# Patient Record
Sex: Female | Born: 1985 | Race: White | Hispanic: No | Marital: Married | Smoking: Former smoker
Health system: Southern US, Community
[De-identification: ages and names within clinical notes are randomized; demographics above are authoritative.]

## PROBLEM LIST (undated history)

## (undated) DIAGNOSIS — J45909 Unspecified asthma, uncomplicated: Secondary | ICD-10-CM

## (undated) DIAGNOSIS — Z789 Other specified health status: Secondary | ICD-10-CM

## (undated) DIAGNOSIS — Z8619 Personal history of other infectious and parasitic diseases: Secondary | ICD-10-CM

## (undated) HISTORY — PX: NO PAST SURGERIES: SHX2092

---
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
## 2004-02-24 ENCOUNTER — Other Ambulatory Visit: Admission: RE | Admit: 2004-02-24 | Discharge: 2004-02-24 | Payer: Self-pay | Admitting: Obstetrics and Gynecology

## 2004-11-29 ENCOUNTER — Other Ambulatory Visit: Admission: RE | Admit: 2004-11-29 | Discharge: 2004-11-29 | Payer: Self-pay | Admitting: Obstetrics and Gynecology

## 2005-12-17 ENCOUNTER — Other Ambulatory Visit: Admission: RE | Admit: 2005-12-17 | Discharge: 2005-12-17 | Payer: Self-pay | Admitting: Obstetrics & Gynecology

## 2007-01-13 ENCOUNTER — Other Ambulatory Visit: Admission: RE | Admit: 2007-01-13 | Discharge: 2007-01-13 | Payer: Self-pay | Admitting: Obstetrics & Gynecology

## 2007-07-24 ENCOUNTER — Ambulatory Visit (HOSPITAL_COMMUNITY): Admission: RE | Admit: 2007-07-24 | Discharge: 2007-07-24 | Payer: Self-pay | Admitting: Family Medicine

## 2008-05-26 ENCOUNTER — Other Ambulatory Visit: Admission: RE | Admit: 2008-05-26 | Discharge: 2008-05-26 | Payer: Self-pay | Admitting: Obstetrics and Gynecology

## 2009-08-25 ENCOUNTER — Ambulatory Visit (HOSPITAL_COMMUNITY): Admission: RE | Admit: 2009-08-25 | Discharge: 2009-08-25 | Payer: Self-pay | Admitting: Family Medicine

## 2009-09-18 HISTORY — PX: BREAST ENHANCEMENT SURGERY: SHX7

## 2010-05-29 IMAGING — CR DG CHEST 2V
2 series · 2 of 2 positions shown · non-contrast
Comparison: None

CLINICAL DATA: Cough.  Wheezing.

CHEST - 2 VIEW

[view not recorded (1 of 2)]
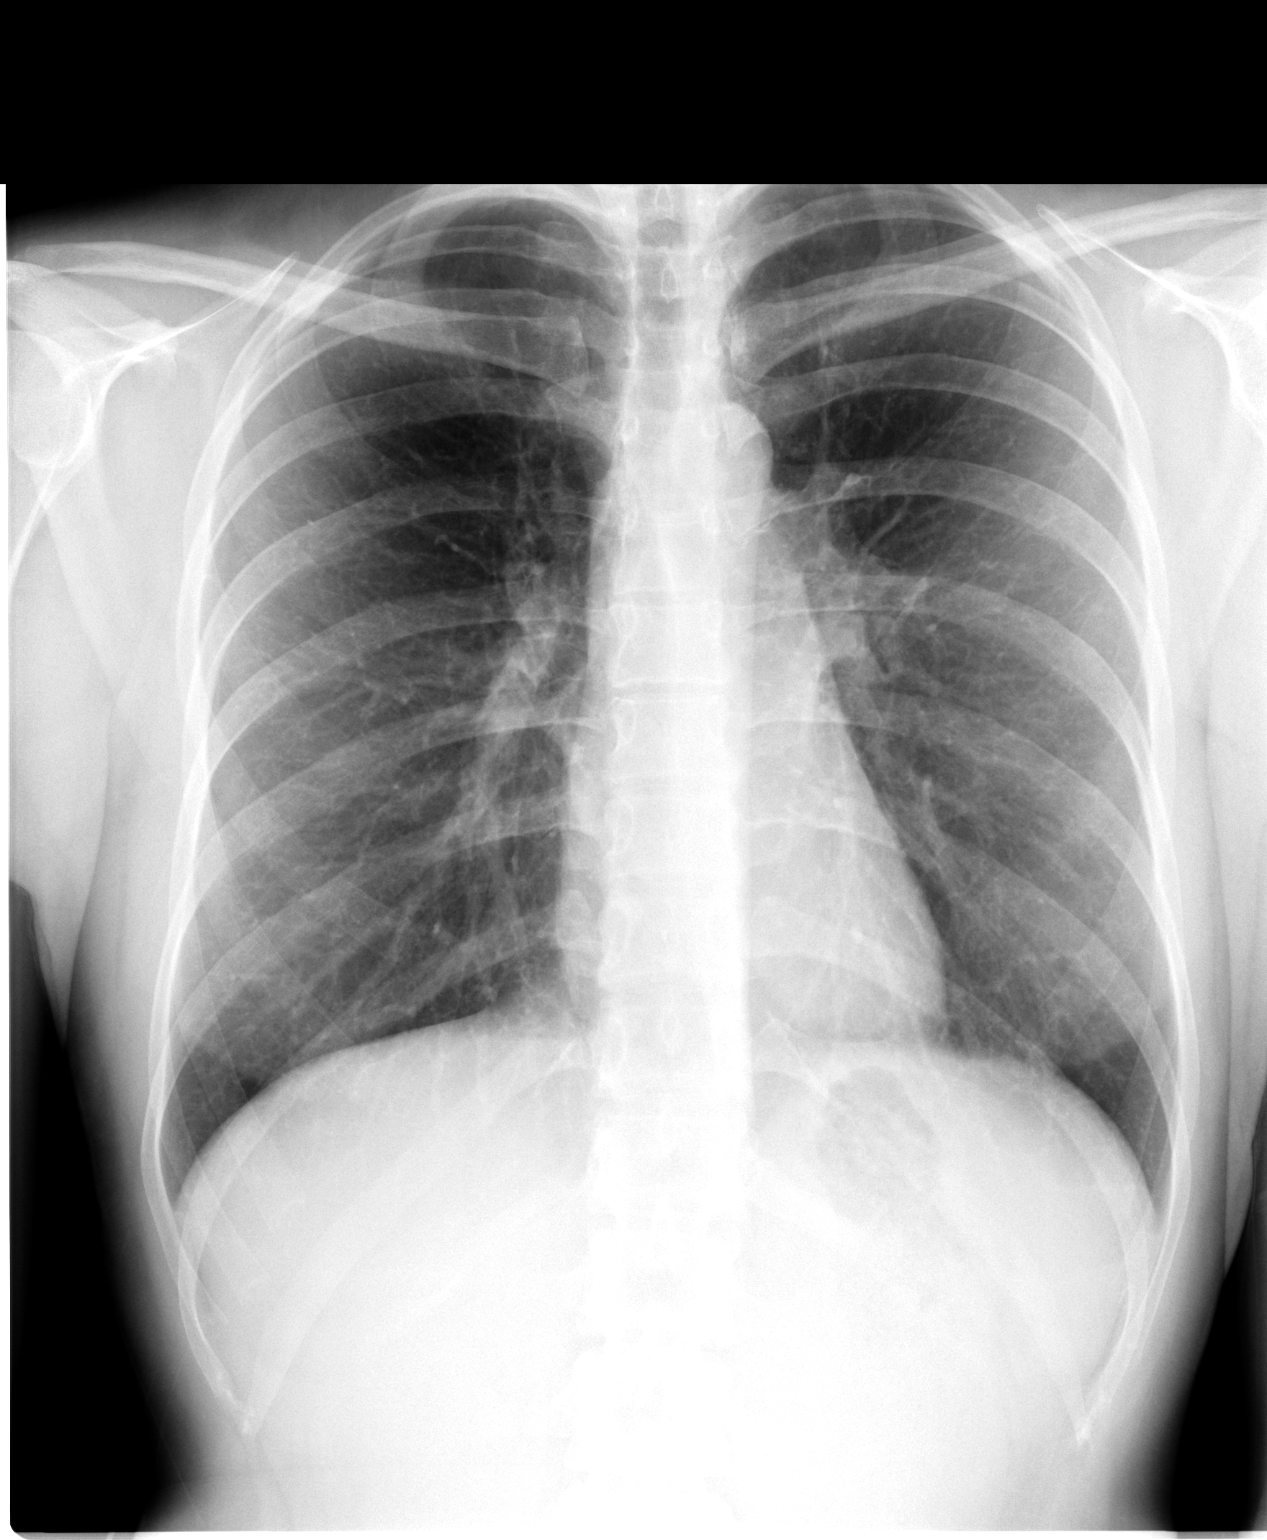

[view not recorded (2 of 2)]
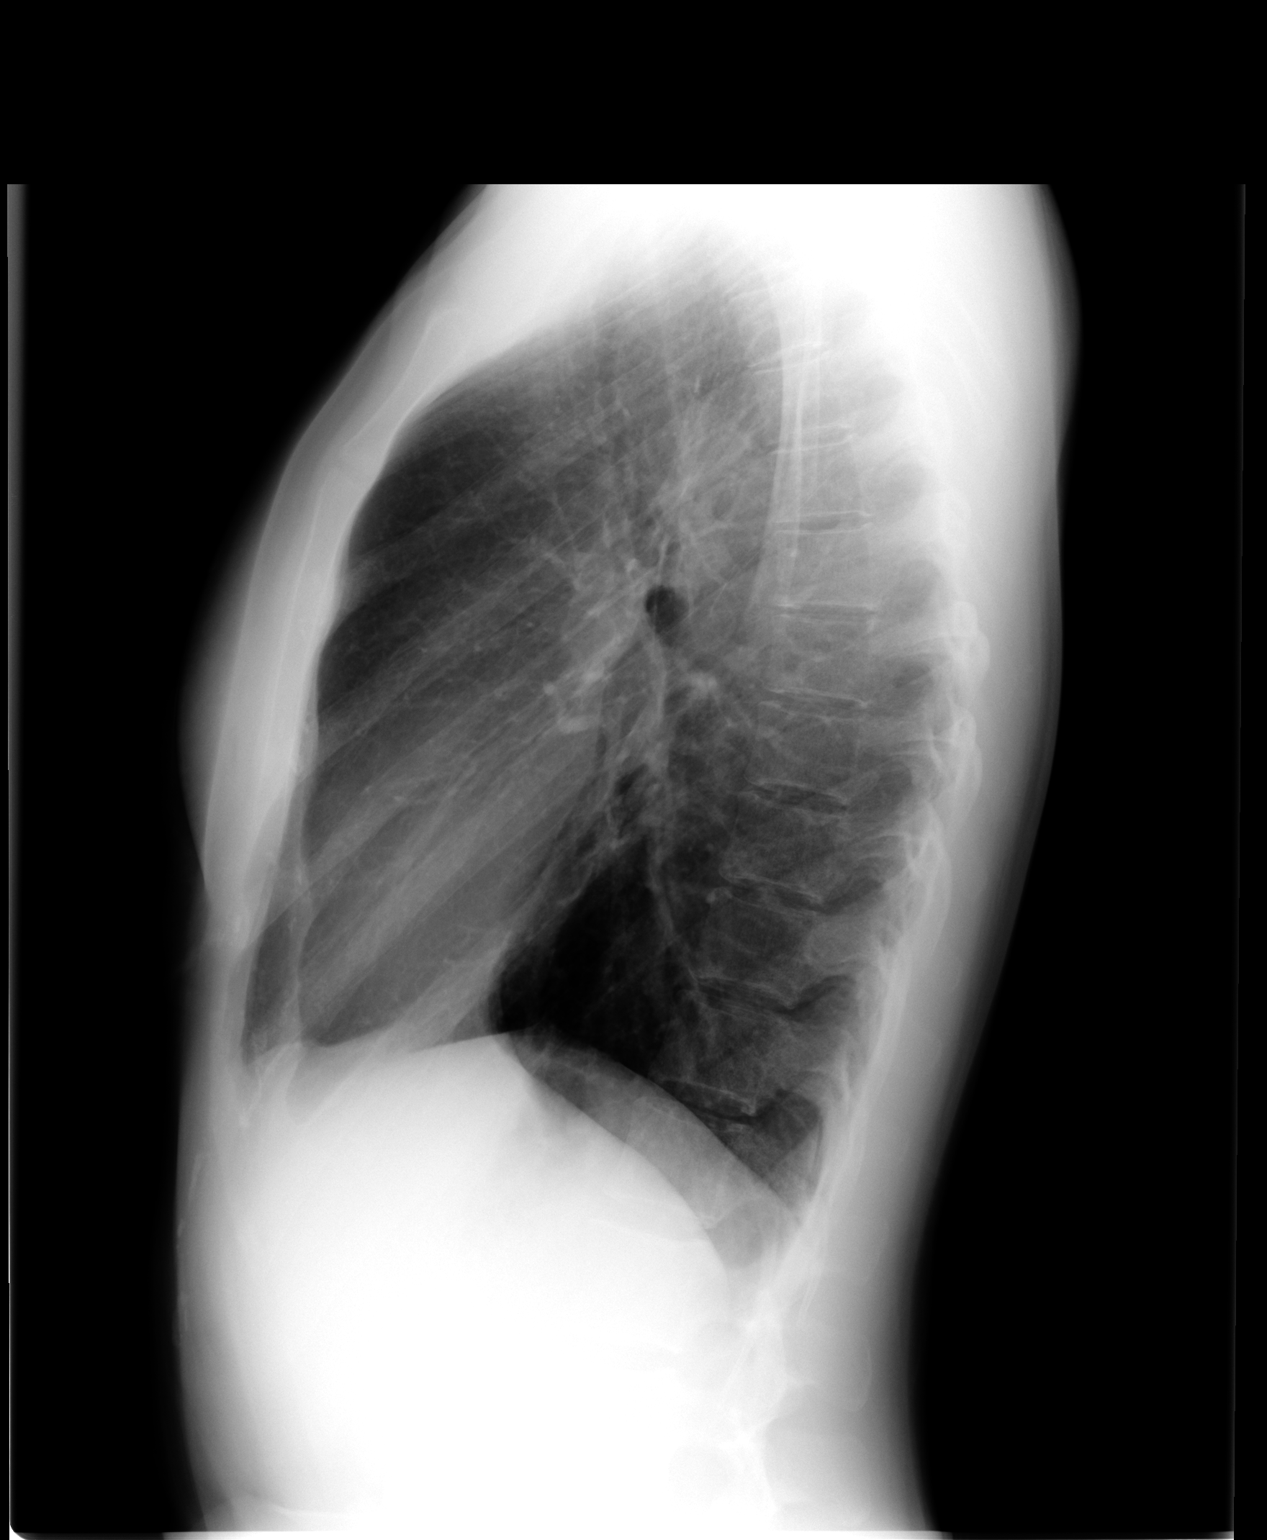

[2 of 2 positions shown; findings below may reference images not displayed]

FINDINGS: The heart size and mediastinal contours are within
normal limits.  Both lungs are clear.  The visualized skeletal
structures are unremarkable.
IMPRESSION: No active cardiopulmonary disease.

## 2013-05-20 ENCOUNTER — Encounter: Payer: Self-pay | Admitting: Gynecology

## 2013-05-24 ENCOUNTER — Ambulatory Visit (INDEPENDENT_AMBULATORY_CARE_PROVIDER_SITE_OTHER): Payer: PRIVATE HEALTH INSURANCE | Admitting: Gynecology

## 2013-05-24 ENCOUNTER — Encounter: Payer: Self-pay | Admitting: Gynecology

## 2013-05-24 VITALS — BP 102/64 | HR 68 | Resp 14 | Ht 66.0 in | Wt 153.0 lb

## 2013-05-24 DIAGNOSIS — Z8742 Personal history of other diseases of the female genital tract: Secondary | ICD-10-CM

## 2013-05-24 DIAGNOSIS — Z87898 Personal history of other specified conditions: Secondary | ICD-10-CM

## 2013-05-24 DIAGNOSIS — N871 Moderate cervical dysplasia: Secondary | ICD-10-CM

## 2013-05-24 DIAGNOSIS — N978 Female infertility of other origin: Secondary | ICD-10-CM

## 2013-05-24 NOTE — Progress Notes (Signed)
Subjective:     Patient ID: Kim [name], female   DOB: 04-12-1986, 27 y.o.   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  Pt states that they found out that her husband has a low sperm count-37m/ml, and have done 3 cycles of insemination without success, she was wondering what the next step should be, right now she feels exhausted and they are taking a break.    Review of Systems     Objective:   Physical Exam  Genitourinary:      Patient's last menstrual period was 05/19/2013.    Blood pressure 102/64, pulse 68, resp. rate 14, height 5\' 6"  (1.676 m), weight 153 lb (69.4 kg), last menstrual period 05/19/2013.  Procedure explained and patient's questions were invited and answered.  Consent form signed.    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   Speculum inserted atraumatically and cervix visualized.  3% acetic acid applied.  Cervix examined using 3.75 and 7.5, 15   X magnification and green filter.    Gross appearance:normal  Squamocolumnar junction seen in entirety: yes   no visible lesions, no mosaicism, no punctation and no abnormal vasculature  endocervical speculum placed and SCJ visualized 360 degrees without lesions, see pix  Patient tolerated procedure well.   Plan:  Will base further treatment on Pathology findings.  Post biopsy instructions and AVS given to patient.            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 Long discussion regarding the affects of HPV, and the anticipated resolution of low grade lesions, pt and husband had used condoms until recently. PAP done. Discussed female factor infertility, best chances for conception to be IVF/ICSI, recommend not using contraception while on break from pregnancy attempts in case they do conceive. Briefly reviewed ASRM recommendations regarding single embryo transfer. Encouraged that both have quit smoking Husband considering urology consult

## 2013-05-26 LAB — IPS CERVICAL/ECC/EMB/VULVAR/VAGINAL BIOPSY

## 2013-06-24 ENCOUNTER — Telehealth: Payer: Self-pay | Admitting: Gynecology

## 2013-06-24 NOTE — Telephone Encounter (Signed)
Call to patient, has been attempting pregnancy for 1 1/2 years Been referred to REI/ husband with abnormal sperm count that has been corrected for last 2 months No bleeding/ some menstrual cramping for several days/breast tenderness noticed today. Advised monitor/observe/ Tylenol and hydration for cramping Repeat UPT as desired with first void and brand name preg test (not from dollar store) Call if 14 days late for menses with no bleeding or if has positive pregnancy test. Will confirm with Dr Farrel Gobble and call back if any additional instructions.  Had 6 month colpo/pap 05-24-13, per chart LMP 05-19-13.

## 2013-06-24 NOTE — Telephone Encounter (Addendum)
Spoke with pt who is 8 days late with her cycle. Pt reports she is "never late." Pt took UPT today and it was negative. Please advise. If someone calls back tomorrow 8-8, they should use cell number instead of work number.

## 2013-06-24 NOTE — Telephone Encounter (Signed)
8 days late on cycle, upt negative.

## 2013-07-01 ENCOUNTER — Ambulatory Visit: Payer: PRIVATE HEALTH INSURANCE | Admitting: Gynecology

## 2013-09-20 ENCOUNTER — Telehealth: Payer: Self-pay | Admitting: Gynecology

## 2013-09-20 NOTE — Telephone Encounter (Signed)
Patient has about 45 days in between cycles for the last 6 months. Patient had fertility treatments before that. Wasn't sure what she needed to do. (looking for chart)

## 2013-09-20 NOTE — Telephone Encounter (Signed)
Spoke with pt who has been having 45 day cycles, with one cycle being 37 days. Pt saw Dr. Laural Benes at [facility] for infertility, after Dr. April Manson left the practice. Her last treatment was in May. Pt and husband were taking a break to see if they could get pregnant on their own. Advised pt she could call Dr. Laural Benes to see what her next step would be, and that I would also route to TL to see if she has any advice. Pt agreeable. Any advice or need for OV here, or should pt see Dr. Laural Benes?

## 2013-09-20 NOTE — Telephone Encounter (Signed)
LMTCB  aa 

## 2013-09-21 NOTE — Telephone Encounter (Signed)
i'd be happy to see her if she wants

## 2013-09-21 NOTE — Telephone Encounter (Addendum)
Spoke with pt who reports she called Dr. Henriette Combs office to let them know about her 45 day cycles, and they did not seem too concerned. Pt says that she has been experiencing 3 week long PMS symptoms and a heaviness towards the end of her period that causes urinary urgency at times. Pt cannot even work out at the end of her cycle because of the heaviness and pressure. "I feel like I'm going to wet on myself." Sched appt 09-27-13 at 4:45.

## 2013-09-21 NOTE — Telephone Encounter (Signed)
LMTCB if pt would like appt to see Dr. Farrel Gobble.

## 2013-09-27 ENCOUNTER — Ambulatory Visit: Payer: PRIVATE HEALTH INSURANCE | Admitting: Gynecology

## 2013-09-28 ENCOUNTER — Ambulatory Visit (INDEPENDENT_AMBULATORY_CARE_PROVIDER_SITE_OTHER): Payer: PRIVATE HEALTH INSURANCE | Admitting: Gynecology

## 2013-09-28 VITALS — BP 116/64 | HR 70 | Resp 12 | Ht 66.0 in | Wt 145.0 lb

## 2013-09-28 DIAGNOSIS — N926 Irregular menstruation, unspecified: Secondary | ICD-10-CM

## 2013-09-28 DIAGNOSIS — N871 Moderate cervical dysplasia: Secondary | ICD-10-CM

## 2013-09-28 DIAGNOSIS — N978 Female infertility of other origin: Secondary | ICD-10-CM

## 2013-09-28 LAB — POCT URINALYSIS DIPSTICK
Leukocytes, UA: NEGATIVE
pH, UA: 5

## 2013-09-28 NOTE — Progress Notes (Signed)
Pt was being seen at [facility] for infertility, treated with femara and IUI, pt reports that she had 6+ follicles but never conceived.  She had 3 cycles.  Female factor also present 24m/mil sperm. Cycles have been irregluar [phone].  Just finished cycle 11/9, started 11/4.   Pt and husband are not interested in IVF at this time, they are trying to sell their house.  Pt had a history of regular cycles until this last few months. Pt runs a total of 23m/wk, but exercises everyday.  ROS: as above PE: BP 116/64  Pulse 70  Resp 12  Ht 5\' 6"  (1.676 m)  Wt 145 lb (65.772 kg)  BMI 23.41 kg/m2 General appearance: alert, cooperative and appears stated age Pelvic: Pelvic exam: normal external genitalia, vulva, vagina, cervix, uterus and adnexa.  Lymph nodes: Inguinal adenopathy: negative Neurologic: Mental status: Alert, oriented, thought content appropriate  Assessment:  infertiltiy-female and female factors Plan: Ok to watch cycles  As long as less than 31m between cycles They can elect to start ocp if they desire, no contraindications to continuing as is Discussed impact of excessive exercise, alcohol will continue with vitamens

## 2013-09-28 NOTE — Addendum Note (Signed)
Addended by: Lorraine Lax on: 09/28/2013 04:11 PM   Modules accepted: Orders

## 2013-12-21 LAB — OB RESULTS CONSOLE HIV ANTIBODY (ROUTINE TESTING): HIV: NONREACTIVE

## 2013-12-21 LAB — OB RESULTS CONSOLE RUBELLA ANTIBODY, IGM: RUBELLA: IMMUNE

## 2013-12-21 LAB — OB RESULTS CONSOLE HEPATITIS B SURFACE ANTIGEN: Hepatitis B Surface Ag: NEGATIVE

## 2014-07-14 ENCOUNTER — Encounter (HOSPITAL_COMMUNITY): Payer: Self-pay

## 2014-07-14 ENCOUNTER — Inpatient Hospital Stay (HOSPITAL_COMMUNITY): Payer: PRIVATE HEALTH INSURANCE

## 2014-07-14 ENCOUNTER — Inpatient Hospital Stay (HOSPITAL_COMMUNITY)
Admission: AD | Admit: 2014-07-14 | Discharge: 2014-07-14 | Disposition: A | Discharge status: Home / Self Care | Payer: PRIVATE HEALTH INSURANCE | Source: Ambulatory Visit | Attending: Obstetrics and Gynecology | Admitting: Obstetrics and Gynecology

## 2014-07-14 DIAGNOSIS — Z87891 Personal history of nicotine dependence: Secondary | ICD-10-CM | POA: Insufficient documentation

## 2014-07-14 DIAGNOSIS — O368390 Maternal care for abnormalities of the fetal heart rate or rhythm, unspecified trimester, not applicable or unspecified: Secondary | ICD-10-CM

## 2014-07-14 DIAGNOSIS — O34219 Maternal care for unspecified type scar from previous cesarean delivery: Secondary | ICD-10-CM | POA: Diagnosis not present

## 2014-07-14 DIAGNOSIS — O139 Gestational [pregnancy-induced] hypertension without significant proteinuria, unspecified trimester: Secondary | ICD-10-CM | POA: Insufficient documentation

## 2014-07-14 DIAGNOSIS — O133 Gestational [pregnancy-induced] hypertension without significant proteinuria, third trimester: Secondary | ICD-10-CM

## 2014-07-14 HISTORY — DX: Other specified health status: Z78.9

## 2014-07-14 LAB — URINE MICROSCOPIC-ADD ON

## 2014-07-14 LAB — CBC
HCT: 34.8 % — ABNORMAL LOW (ref 36.0–46.0)
Hemoglobin: 12.2 g/dL (ref 12.0–15.0)
MCH: 33.2 pg (ref 26.0–34.0)
MCHC: 35.1 g/dL (ref 30.0–36.0)
MCV: 94.6 fL (ref 78.0–100.0)
PLATELETS: 203 10*3/uL (ref 150–400)
RBC: 3.68 MIL/uL — ABNORMAL LOW (ref 3.87–5.11)
RDW: 13.1 % (ref 11.5–15.5)
WBC: 7.2 10*3/uL (ref 4.0–10.5)

## 2014-07-14 LAB — URINALYSIS, ROUTINE W REFLEX MICROSCOPIC
Bilirubin Urine: NEGATIVE
GLUCOSE, UA: NEGATIVE mg/dL
HGB URINE DIPSTICK: NEGATIVE
Ketones, ur: NEGATIVE mg/dL
Nitrite: NEGATIVE
PH: 7 (ref 5.0–8.0)
Protein, ur: NEGATIVE mg/dL
Urobilinogen, UA: 0.2 mg/dL (ref 0.0–1.0)

## 2014-07-14 LAB — PROTEIN / CREATININE RATIO, URINE
CREATININE, URINE: 26.36 mg/dL
PROTEIN CREATININE RATIO: 0.15 (ref 0.00–0.15)
Total Protein, Urine: 4 mg/dL

## 2014-07-14 LAB — COMPREHENSIVE METABOLIC PANEL
ALBUMIN: 2.5 g/dL — AB (ref 3.5–5.2)
ALT: 13 U/L (ref 0–35)
AST: 21 U/L (ref 0–37)
Alkaline Phosphatase: 120 U/L — ABNORMAL HIGH (ref 39–117)
Anion gap: 13 (ref 5–15)
BUN: 7 mg/dL (ref 6–23)
CALCIUM: 9 mg/dL (ref 8.4–10.5)
CHLORIDE: 103 meq/L (ref 96–112)
CO2: 21 mEq/L (ref 19–32)
CREATININE: 0.51 mg/dL (ref 0.50–1.10)
GFR calc Af Amer: >90 mL/min (ref >90)
GFR calc non Af Amer: >90 mL/min (ref >90)
Glucose, Bld: 69 mg/dL — ABNORMAL LOW (ref 70–99)
Potassium: 4.3 mEq/L (ref 3.7–5.3)
Sodium: 137 mEq/L (ref 137–147)
Total Bilirubin: <0.2 mg/dL — ABNORMAL LOW (ref 0.3–1.2)
Total Protein: 5.7 g/dL — ABNORMAL LOW (ref 6.0–8.3)

## 2014-07-14 LAB — SAMPLE TO BLOOD BANK

## 2014-07-14 LAB — LACTATE DEHYDROGENASE: LDH: 182 U/L (ref 94–250)

## 2014-07-14 LAB — URIC ACID: URIC ACID, SERUM: 5.7 mg/dL (ref 2.4–7.0)

## 2014-07-14 NOTE — MAU Note (Signed)
Patient states the baby is breech and is scheduled for a cesarean section on 9-9.

## 2014-07-14 NOTE — MAU Note (Signed)
Patient states she was in the office today for a regular visit and her blood pressure was elevated. Sent to MAU for further evaluation. Patient states she has been having dizzy spells and seeing stars with occasional headaches over the past week to two weeks. Some swelling. Having some mild cramping. Denies bleeding or leaking and reports good fetal movement.

## 2014-07-14 NOTE — MAU Provider Note (Signed)
History     CSN: [id number]  Arrival date and time: 07/14/14 1338   First Provider Initiated Contact with Patient 07/14/14 1430      Chief Complaint  Patient presents with  . Hypertension   Hypertension Associated symptoms include blurred vision (none now) and headaches (none now). Pertinent negatives include no chest pain or malaise/fatigue.   This is a 28 y.o. female at [redacted]w[redacted]d who presents for evaluation of hypertension.  She has had some headaches (none now), vision changes and swelling. No abdominal pain. Has  C/S scheduled for breech.   RN Note:  Patient states she was in the office today for a regular visit and her blood pressure was elevated. Sent to MAU for further evaluation. Patient states she has been having dizzy spells and seeing stars with occasional headaches over the past week to two weeks. Some swelling. Having some mild cramping. Denies bleeding or leaking and reports good fetal movement.        OB History   Grav Para Term Preterm Abortions TAB SAB Ect Mult Living   1 0 0       2      Past Medical History  Diagnosis Date  . Medical history non-contributory     Past Surgical History  Procedure Laterality Date  . Vulva /perineum biopsy  12/2003    Condyloma  . Breast enhancement surgery  11/10  . No past surgeries      History reviewed. No pertinent family history.  History  Substance Use Topics  . Smoking status: Former Games developer  . Smokeless tobacco: Not on file  . Alcohol Use: No    Allergies:  Allergies  Allergen Reactions  . Biaxin [Clarithromycin]   . Cefaclor   . Codeine Itching  . Penicillins   . Sulfa Antibiotics     Prescriptions prior to admission  Medication Sig Dispense Refill  . folic acid (FOLVITE) 400 MCG tablet Take 400 mcg by mouth daily.      . Multiple Vitamins-Minerals (MULTIVITAMIN GUMMIES ADULT PO) Take by mouth.        Review of Systems  Constitutional: Negative for fever, chills and malaise/fatigue.  Eyes:  Positive for blurred vision (none now) and double vision.  Cardiovascular: Positive for leg swelling. Negative for chest pain.  Gastrointestinal: Negative for nausea, vomiting and abdominal pain.  Neurological: Positive for headaches (none now). Negative for dizziness, sensory change, speech change, focal weakness and seizures.   Physical Exam   Blood pressure 135/97, pulse 79, temperature 99 F (37.2 C), temperature source Oral, resp. rate 16, height  (1.676 m), weight 93.35 kg (205 lb 12.8 oz), SpO2 99.00%.  Filed Vitals:   07/14/14 1409  BP: 135/97  Pulse: 79  Temp: 99 F (37.2 C)  TempSrc: Oral  Resp: 16  Height:  (1.676 m)  Weight: 93.35 kg (205 lb 12.8 oz)  SpO2: 99%   Filed Vitals:   07/14/14 1447 07/14/14 1502 07/14/14 1517 07/14/14 1532  BP: 136/92 139/85 125/83 128/84  Pulse: 77 75 74 71  Temp:      TempSrc:      Resp:      Height:      Weight:      SpO2:         Physical Exam  Constitutional: She is oriented to person, place, and time. She appears well-developed and well-nourished. No distress.  HENT:  Head: Normocephalic.  Cardiovascular: Normal rate, regular rhythm and normal heart sounds.  Exam reveals  no gallop and no friction rub.   No murmur heard. Respiratory: Effort normal. No respiratory distress. She has no wheezes. She has no rales.  GI: Soft. She exhibits no distension. There is no tenderness. There is no rebound and no guarding.  Musculoskeletal: She exhibits edema (1+).  Neurological: She is alert and oriented to person, place, and time. She displays normal reflexes (2+). She exhibits normal muscle tone (0-1 beat clonus).  Skin: Skin is warm and dry.  Psychiatric: She has a normal mood and affect.   Bedside US done:  Verified breech presentation with spine to maternal right.                                  Appears to be complete breech                                 Good fetal movement                                 Fetal  breathing seen  MAU Course  Procedures  MDM Results for orders placed during the hospital encounter of 07/14/14 (from the past 24 hour(s))  PROTEIN / CREATININE RATIO, URINE     Status: None   Collection Time    07/14/14  2:15 PM      Result Value Ref Range   Creatinine, Urine 26.36     Total Protein, Urine 4     PROTEIN CREATININE RATIO 0.15  0.00 - 0.15  URINALYSIS, ROUTINE W REFLEX MICROSCOPIC     Status: Abnormal   Collection Time    07/14/14  2:15 PM      Result Value Ref Range   Color, Urine YELLOW  YELLOW   APPearance CLEAR  CLEAR   Specific Gravity, Urine <1.005 (*) 1.005 - 1.030   pH 7.0  5.0 - 8.0   Glucose, UA NEGATIVE  NEGATIVE mg/dL   Hgb urine dipstick NEGATIVE  NEGATIVE   Bilirubin Urine NEGATIVE  NEGATIVE   Ketones, ur NEGATIVE  NEGATIVE mg/dL   Protein, ur NEGATIVE  NEGATIVE mg/dL   Urobilinogen, UA 0.2  0.0 - 1.0 mg/dL   Nitrite NEGATIVE  NEGATIVE   Leukocytes, UA SMALL (*) NEGATIVE  URINE MICROSCOPIC-ADD ON     Status: Abnormal   Collection Time    07/14/14  2:15 PM      Result Value Ref Range   Squamous Epithelial / LPF FEW (*) RARE   WBC, UA 3-6  <3 WBC/hpf   Bacteria, UA FEW (*) RARE  CBC     Status: Abnormal   Collection Time    07/14/14  2:55 PM      Result Value Ref Range   WBC 7.2  4.0 - 10.5 K/uL   RBC 3.68 (*) 3.87 - 5.11 MIL/uL   Hemoglobin 12.2  12.0 - 15.0 g/dL   HCT 87.5 (*) 64.3 - 32.9 %   MCV 94.6  78.0 - 100.0 fL   MCH 33.2  26.0 - 34.0 pg   MCHC 35.1  30.0 - 36.0 g/dL   RDW 51.8  84.1 - 66.0 %   Platelets 203  150 - 400 K/uL  COMPREHENSIVE METABOLIC PANEL     Status: Abnormal   Collection Time    07/14/14  2:55  PM      Result Value Ref Range   Sodium 137  137 - 147 mEq/L   Potassium 4.3  3.7 - 5.3 mEq/L   Chloride 103  96 - 112 mEq/L   CO2 21  19 - 32 mEq/L   Glucose, Bld 69 (*) 70 - 99 mg/dL   BUN 7  6 - 23 mg/dL   Creatinine, Ser 9.56  0.50 - 1.10 mg/dL   Calcium 9.0  8.4 - 21.3 mg/dL   Total Protein 5.7 (*) 6.0 - 8.3  g/dL   Albumin 2.5 (*) 3.5 - 5.2 g/dL   AST 21  0 - 37 U/L   ALT 13  0 - 35 U/L   Alkaline Phosphatase 120 (*) 39 - 117 U/L   Total Bilirubin <0.2 (*) 0.3 - 1.2 mg/dL   GFR calc non Af Amer >90  >90 mL/min   GFR calc Af Amer >90  >90 mL/min   Anion gap 13  5 - 15  URIC ACID     Status: None   Collection Time    07/14/14  2:55 PM      Result Value Ref Range   Uric Acid, Serum 5.7  2.4 - 7.0 mg/dL  LACTATE DEHYDROGENASE     Status: None   Collection Time    07/14/14  2:55 PM      Result Value Ref Range   LDH 182  94 - 250 U/L     Assessment and Plan  A:  SIUP at [redacted]w[redacted]d       Gestational hypertension  [facility] 07/14/2014, 2:36 PM   After patient left, RN noted one variable deceleration on an otherwise reactive tracing.  Dr Rana Snare consulted, patient notified to come back for BPP/AFI  BPP was 8/8 AFI was 5.07 Dr Carney Bern commented on his reading that AFI was normal and no followup was needed unless clinically indicated.  Dr Rana Snare notified of official reading of [place] Will have patient go to office in AM for recheck of AFI. Aviva Signs, CNM

## 2014-07-14 NOTE — Discharge Instructions (Signed)
Preeclampsia and Eclampsia °Preeclampsia is a serious condition that develops only during pregnancy. It is also called toxemia of pregnancy. This condition causes high blood pressure along with other symptoms, such as swelling and headaches. These may develop as the condition gets worse. Preeclampsia may occur 20 weeks or later into your pregnancy.  °Diagnosing and treating preeclampsia early is very important. If not treated early, it can cause serious problems for you and your baby. One problem it can lead to is eclampsia, which is a condition that causes muscle jerking or shaking (convulsions) in the mother. Delivering your baby is the best treatment for preeclampsia or eclampsia.  °RISK FACTORS °The cause of preeclampsia is not known. You may be more likely to develop preeclampsia if you have certain risk factors. These include:  °· Being pregnant for the first time. °· Having preeclampsia in a past pregnancy. °· Having a family history of preeclampsia. °· Having high blood pressure. °· Being pregnant with twins or triplets. °· Being 35 or older. °· Being African American. °· Having kidney disease or diabetes. °· Having medical conditions such as lupus or blood diseases. °· Being very overweight (obese). °SIGNS AND SYMPTOMS  °The earliest signs of preeclampsia are: °· High blood pressure. °· Increased protein in your urine. Your health care provider will check for this at every prenatal visit. °Other symptoms that can develop include:  °· Severe headaches. °· Sudden weight gain. °· Swelling of your hands, face, legs, and feet. °· Feeling sick to your stomach (nauseous) and throwing up (vomiting). °· Vision problems (blurred or double vision). °· Numbness in your face, arms, legs, and feet. °· Dizziness. °· Slurred speech. °· Sensitivity to bright lights. °· Abdominal pain. °DIAGNOSIS  °There are no screening tests for preeclampsia. Your health care provider will ask you about symptoms and check for signs of  preeclampsia during your prenatal visits. You may also have tests, including: °· Urine testing. °· Blood testing. °· Checking your baby's heart rate. °· Checking the health of your baby and your placenta using images created with sound waves (ultrasound). °TREATMENT  °You can work out the best treatment approach together with your health care provider. It is very important to keep all prenatal appointments. If you have an increased risk of preeclampsia, you may need more frequent prenatal exams. °· Your health care provider may prescribe bed rest. °· You may have to eat as little salt as possible. °· You may need to take medicine to lower your blood pressure if the condition does not respond to more conservative measures. °· You may need to stay in the hospital if your condition is severe. There, treatment will focus on controlling your blood pressure and fluid retention. You may also need to take medicine to prevent seizures. °· If the condition gets worse, your baby may need to be delivered early to protect you and the baby. You may have your labor started with medicine (be induced), or you may have a cesarean delivery. °· Preeclampsia usually goes away after the baby is born. °HOME CARE INSTRUCTIONS  °· Only take over-the-counter or prescription medicines as directed by your health care provider. °· Lie on your left side while resting. This keeps pressure off your baby. °· Elevate your feet while resting. °· Get regular exercise. Ask your health care provider what type of exercise is safe for you. °· Avoid caffeine and alcohol. °· Do not smoke. °· Drink 6-8 glasses of water every day. °· Eat a balanced diet   that is low in salt. Do not add salt to your food. °· Avoid stressful situations as much as possible. °· Get plenty of rest and sleep. °· Keep all prenatal appointments and tests as scheduled. °SEEK MEDICAL CARE IF: °· You are gaining more weight than expected. °· You have any headaches, abdominal pain, or  nausea. °· You are bruising more than usual. °· You feel dizzy or light-headed. °SEEK IMMEDIATE MEDICAL CARE IF:  °· You develop sudden or severe swelling anywhere in your body. This usually happens in the legs. °· You gain 5 lb (2.3 kg) or more in a week. °· You have a severe headache, dizziness, problems with your vision, or confusion. °· You have severe abdominal pain. °· You have lasting nausea or vomiting. °· You have a seizure. °· You have trouble moving any part of your body. °· You develop numbness in your body. °· You have trouble speaking. °· You have any abnormal bleeding. °· You develop a stiff neck. °· You pass out. °MAKE SURE YOU:  °· Understand these instructions. °· Will watch your condition. °· Will get help right away if you are not doing well or get worse. °Document Released: 11/01/2000 Document Revised: 11/09/2013 Document Reviewed: 08/27/2013 °ExitCare® Patient Information ©2015 [employer]. This information is not intended to replace advice given to you by your health care provider. Make sure you discuss any questions you have with your health care provider. ° ° ° °Hypertension During Pregnancy °Hypertension is also called high blood pressure. Blood pressure moves blood in your body. Sometimes, the force that moves the blood becomes too strong. When you are pregnant, this condition should be watched carefully. It can cause problems for you and your baby. °HOME CARE  °· Make and keep all of your doctor visits. °· Take medicine as told by your doctor. Tell your doctor about all medicines you take. °· Eat very little salt. °· Exercise regularly. °· Do not drink alcohol. °· Do not smoke. °· Do not have drinks with caffeine. °· Lie on your left side when resting. °· Your health care provider may ask you to take one low-dose aspirin (81mg) each day. °GET HELP RIGHT AWAY IF: °· You have bad belly (abdominal) pain. °· You have sudden puffiness (swelling) in the hands, ankles, or face. °· You gain 4  pounds (1.8 kilograms) or more in 1 week. °· You throw up (vomit) repeatedly. °· You have bleeding from the vagina. °· You do not feel the baby moving as much. °· You have a headache. °· You have blurred or double vision. °· You have muscle twitching or spasms. °· You have shortness of breath. °· You have blue fingernails and lips. °· You have blood in your pee (urine). °MAKE SURE YOU: °· Understand these instructions. °· Will watch your condition. °· Will get help right away if you are not doing well or get worse. °Document Released: 12/07/2010 Document Revised: 03/21/2014 Document Reviewed: 06/03/2013 °ExitCare® Patient Information ©2015 [employer]. This information is not intended to replace advice given to you by your health care provider. Make sure you discuss any questions you have with your health care provider. ° °

## 2014-07-14 NOTE — MAU Note (Signed)
Pt to return for BPP/AFI.

## 2014-07-15 ENCOUNTER — Inpatient Hospital Stay (HOSPITAL_COMMUNITY): Payer: PRIVATE HEALTH INSURANCE | Admitting: Anesthesiology

## 2014-07-15 ENCOUNTER — Inpatient Hospital Stay (HOSPITAL_COMMUNITY)
Admission: AD | Admit: 2014-07-15 | Payer: PRIVATE HEALTH INSURANCE | Source: Ambulatory Visit | Admitting: Obstetrics and Gynecology

## 2014-07-15 ENCOUNTER — Encounter (HOSPITAL_COMMUNITY): Payer: PRIVATE HEALTH INSURANCE | Admitting: Anesthesiology

## 2014-07-15 ENCOUNTER — Encounter (HOSPITAL_COMMUNITY)
Admission: AD | Disposition: A | Discharge status: Home / Self Care | Payer: Self-pay | Source: Ambulatory Visit | Attending: Obstetrics and Gynecology

## 2014-07-15 ENCOUNTER — Encounter (HOSPITAL_COMMUNITY): Payer: Self-pay | Admitting: *Deleted

## 2014-07-15 ENCOUNTER — Inpatient Hospital Stay (HOSPITAL_COMMUNITY)
Admission: AD | Admit: 2014-07-15 | Discharge: 2014-07-18 | DRG: 765 | Disposition: A | Discharge status: Home / Self Care | Payer: PRIVATE HEALTH INSURANCE | Source: Ambulatory Visit | Attending: Obstetrics and Gynecology | Admitting: Obstetrics and Gynecology

## 2014-07-15 DIAGNOSIS — O139 Gestational [pregnancy-induced] hypertension without significant proteinuria, unspecified trimester: Secondary | ICD-10-CM | POA: Diagnosis present

## 2014-07-15 DIAGNOSIS — Z87891 Personal history of nicotine dependence: Secondary | ICD-10-CM

## 2014-07-15 DIAGNOSIS — Z98891 History of uterine scar from previous surgery: Secondary | ICD-10-CM

## 2014-07-15 DIAGNOSIS — O321XX0 Maternal care for breech presentation, not applicable or unspecified: Secondary | ICD-10-CM | POA: Diagnosis present

## 2014-07-15 LAB — CBC
HCT: 33.7 % — ABNORMAL LOW (ref 36.0–46.0)
Hemoglobin: 11.8 g/dL — ABNORMAL LOW (ref 12.0–15.0)
MCH: 33.2 pg (ref 26.0–34.0)
MCHC: 35 g/dL (ref 30.0–36.0)
MCV: 94.9 fL (ref 78.0–100.0)
Platelets: 197 10*3/uL (ref 150–400)
RBC: 3.55 MIL/uL — AB (ref 3.87–5.11)
RDW: 13.1 % (ref 11.5–15.5)
WBC: 9.2 10*3/uL (ref 4.0–10.5)

## 2014-07-15 LAB — TYPE AND SCREEN
ABO/RH(D): O POS
Antibody Screen: NEGATIVE

## 2014-07-15 LAB — ABO/RH: ABO/RH(D): O POS

## 2014-07-15 SURGERY — Surgical Case
Anesthesia: Spinal | Site: Abdomen

## 2014-07-15 MED ORDER — MEPERIDINE HCL 25 MG/ML IJ SOLN: 6.2500 mg | INTRAMUSCULAR | Status: DC | PRN

## 2014-07-15 MED ORDER — FENTANYL CITRATE 0.05 MG/ML IJ SOLN
INTRAMUSCULAR | Status: AC
  Filled 2014-07-15: qty 2

## 2014-07-15 MED ORDER — DIPHENHYDRAMINE HCL 50 MG/ML IJ SOLN: 12.5000 mg | INTRAMUSCULAR | Status: DC | PRN

## 2014-07-15 MED ORDER — LACTATED RINGERS IV SOLN: INTRAVENOUS | Status: DC

## 2014-07-15 MED ORDER — MORPHINE SULFATE 0.5 MG/ML IJ SOLN
INTRAMUSCULAR | Status: AC
  Filled 2014-07-15: qty 10

## 2014-07-15 MED ORDER — DIBUCAINE 1 % RE OINT: 1.0000 "application " | TOPICAL_OINTMENT | RECTAL | Status: DC | PRN

## 2014-07-15 MED ORDER — MORPHINE SULFATE (PF) 0.5 MG/ML IJ SOLN
INTRAMUSCULAR | Status: DC | PRN
  Administered 2014-07-15: .1 mg via INTRATHECAL

## 2014-07-15 MED ORDER — ONDANSETRON HCL 4 MG/2ML IJ SOLN: 4.0000 mg | Freq: Three times a day (TID) | INTRAMUSCULAR | Status: DC | PRN

## 2014-07-15 MED ORDER — METOCLOPRAMIDE HCL 5 MG/ML IJ SOLN: 10.0000 mg | Freq: Three times a day (TID) | INTRAMUSCULAR | Status: DC | PRN

## 2014-07-15 MED ORDER — KETOROLAC TROMETHAMINE 30 MG/ML IJ SOLN
30.0000 mg | Freq: Four times a day (QID) | INTRAMUSCULAR | Status: AC | PRN
  Administered 2014-07-16: 30 mg via INTRAVENOUS

## 2014-07-15 MED ORDER — MEPERIDINE HCL 25 MG/ML IJ SOLN
INTRAMUSCULAR | Status: DC | PRN
  Administered 2014-07-15: 12.5 mg via INTRAVENOUS

## 2014-07-15 MED ORDER — FENTANYL CITRATE 0.05 MG/ML IJ SOLN
INTRAMUSCULAR | Status: DC | PRN
  Administered 2014-07-15: 25 ug via INTRATHECAL
  Administered 2014-07-15 (×2): 12.5 ug via INTRAVENOUS

## 2014-07-15 MED ORDER — SCOPOLAMINE 1 MG/3DAYS TD PT72
MEDICATED_PATCH | TRANSDERMAL | Status: AC
  Filled 2014-07-15: qty 1

## 2014-07-15 MED ORDER — CITRIC ACID-SODIUM CITRATE 334-500 MG/5ML PO SOLN
30.0000 mL | Freq: Once | ORAL | Status: AC
Start: 2014-07-15 — End: 2014-07-15
  Administered 2014-07-15: 30 mL via ORAL
  Filled 2014-07-15: qty 15

## 2014-07-15 MED ORDER — SIMETHICONE 80 MG PO CHEW
80.0000 mg | CHEWABLE_TABLET | ORAL | Status: DC
Start: 2014-07-16 — End: 2014-07-18
  Administered 2014-07-17 (×2): 80 mg via ORAL
  Filled 2014-07-15 (×2): qty 1

## 2014-07-15 MED ORDER — PHENYLEPHRINE HCL 10 MG/ML IJ SOLN
INTRAMUSCULAR | Status: DC | PRN
  Administered 2014-07-15: 80 ug via INTRAVENOUS

## 2014-07-15 MED ORDER — ONDANSETRON HCL 4 MG/2ML IJ SOLN
INTRAMUSCULAR | Status: DC | PRN
  Administered 2014-07-15: 4 mg via INTRAVENOUS

## 2014-07-15 MED ORDER — SIMETHICONE 80 MG PO CHEW
80.0000 mg | CHEWABLE_TABLET | ORAL | Status: DC | PRN
Start: 2014-07-15 — End: 2014-07-18

## 2014-07-15 MED ORDER — OXYCODONE-ACETAMINOPHEN 5-325 MG PO TABS
1.0000 | ORAL_TABLET | ORAL | Status: DC | PRN
  Administered 2014-07-16: 1 via ORAL
  Filled 2014-07-15: qty 1

## 2014-07-15 MED ORDER — WITCH HAZEL-GLYCERIN EX PADS
1.0000 | MEDICATED_PAD | CUTANEOUS | Status: DC | PRN
Start: 2014-07-15 — End: 2014-07-18

## 2014-07-15 MED ORDER — PRENATAL MULTIVITAMIN CH
1.0000 | ORAL_TABLET | Freq: Every day | ORAL | Status: DC
  Administered 2014-07-17 – 2014-07-18 (×2): 1 via ORAL
  Filled 2014-07-15: qty 1

## 2014-07-15 MED ORDER — SCOPOLAMINE 1 MG/3DAYS TD PT72
1.0000 | MEDICATED_PATCH | Freq: Once | TRANSDERMAL | Status: DC
  Administered 2014-07-15: 1.5 mg via TRANSDERMAL

## 2014-07-15 MED ORDER — SODIUM CHLORIDE 0.9 % IJ SOLN: 3.0000 mL | INTRAMUSCULAR | Status: DC | PRN

## 2014-07-15 MED ORDER — OXYTOCIN 10 UNIT/ML IJ SOLN
40.0000 [IU] | INTRAVENOUS | Status: DC | PRN
  Administered 2014-07-15: 40 [IU] via INTRAVENOUS

## 2014-07-15 MED ORDER — OXYTOCIN 10 UNIT/ML IJ SOLN
INTRAMUSCULAR | Status: AC
  Filled 2014-07-15: qty 4

## 2014-07-15 MED ORDER — NALBUPHINE HCL 10 MG/ML IJ SOLN
5.0000 mg | INTRAMUSCULAR | Status: DC | PRN
  Administered 2014-07-15 – 2014-07-16 (×3): 10 mg via SUBCUTANEOUS
  Filled 2014-07-15 (×3): qty 1

## 2014-07-15 MED ORDER — DIPHENHYDRAMINE HCL 25 MG PO CAPS
25.0000 mg | ORAL_CAPSULE | ORAL | Status: DC | PRN
  Filled 2014-07-15: qty 1

## 2014-07-15 MED ORDER — TETANUS-DIPHTH-ACELL PERTUSSIS 5-2.5-18.5 LF-MCG/0.5 IM SUSP
0.5000 mL | Freq: Once | INTRAMUSCULAR | Status: DC
Start: 2014-07-16 — End: 2014-07-18

## 2014-07-15 MED ORDER — GENTAMICIN SULFATE 40 MG/ML IJ SOLN: INTRAMUSCULAR | Status: DC

## 2014-07-15 MED ORDER — ONDANSETRON HCL 4 MG/2ML IJ SOLN
INTRAMUSCULAR | Status: AC
  Filled 2014-07-15: qty 2

## 2014-07-15 MED ORDER — KETOROLAC TROMETHAMINE 30 MG/ML IJ SOLN
30.0000 mg | Freq: Four times a day (QID) | INTRAMUSCULAR | Status: AC | PRN
  Administered 2014-07-15: 30 mg via INTRAMUSCULAR
  Filled 2014-07-15: qty 1

## 2014-07-15 MED ORDER — SIMETHICONE 80 MG PO CHEW
80.0000 mg | CHEWABLE_TABLET | Freq: Three times a day (TID) | ORAL | Status: DC
  Administered 2014-07-16 – 2014-07-18 (×5): 80 mg via ORAL
  Filled 2014-07-15 (×6): qty 1

## 2014-07-15 MED ORDER — LACTATED RINGERS IV SOLN
INTRAVENOUS | Status: DC | PRN
  Administered 2014-07-15 (×2): via INTRAVENOUS

## 2014-07-15 MED ORDER — NALOXONE HCL 1 MG/ML IJ SOLN
1.0000 ug/kg/h | INTRAVENOUS | Status: DC | PRN
  Filled 2014-07-15: qty 2

## 2014-07-15 MED ORDER — DIPHENHYDRAMINE HCL 50 MG/ML IJ SOLN: 25.0000 mg | INTRAMUSCULAR | Status: DC | PRN

## 2014-07-15 MED ORDER — ONDANSETRON HCL 4 MG/2ML IJ SOLN
4.0000 mg | INTRAMUSCULAR | Status: DC | PRN
Start: 2014-07-15 — End: 2014-07-18

## 2014-07-15 MED ORDER — DEXTROSE 5 % IV SOLN
INTRAVENOUS | Status: AC
  Administered 2014-07-15: 115 mL via INTRAVENOUS
  Filled 2014-07-15: qty 9

## 2014-07-15 MED ORDER — MENTHOL 3 MG MT LOZG: 1.0000 | LOZENGE | OROMUCOSAL | Status: DC | PRN

## 2014-07-15 MED ORDER — SENNOSIDES-DOCUSATE SODIUM 8.6-50 MG PO TABS
2.0000 | ORAL_TABLET | ORAL | Status: DC
  Administered 2014-07-17 (×2): 2 via ORAL
  Filled 2014-07-15 (×2): qty 2

## 2014-07-15 MED ORDER — DIPHENHYDRAMINE HCL 25 MG PO CAPS: 25.0000 mg | ORAL_CAPSULE | Freq: Four times a day (QID) | ORAL | Status: DC | PRN

## 2014-07-15 MED ORDER — NALBUPHINE HCL 10 MG/ML IJ SOLN
5.0000 mg | INTRAMUSCULAR | Status: DC | PRN
  Administered 2014-07-16: 10 mg via INTRAVENOUS
  Filled 2014-07-15: qty 1

## 2014-07-15 MED ORDER — IBUPROFEN 600 MG PO TABS
600.0000 mg | ORAL_TABLET | Freq: Four times a day (QID) | ORAL | Status: DC
  Administered 2014-07-16 – 2014-07-18 (×10): 600 mg via ORAL
  Filled 2014-07-15 (×9): qty 1

## 2014-07-15 MED ORDER — HYDROMORPHONE HCL PF 1 MG/ML IJ SOLN: 0.2500 mg | INTRAMUSCULAR | Status: DC | PRN

## 2014-07-15 MED ORDER — ZOLPIDEM TARTRATE 5 MG PO TABS: 5.0000 mg | ORAL_TABLET | Freq: Every evening | ORAL | Status: DC | PRN

## 2014-07-15 MED ORDER — MEPERIDINE HCL 25 MG/ML IJ SOLN
INTRAMUSCULAR | Status: AC
  Filled 2014-07-15: qty 1

## 2014-07-15 MED ORDER — BUPIVACAINE HCL (PF) 0.25 % IJ SOLN
INTRAMUSCULAR | Status: AC
  Filled 2014-07-15: qty 30

## 2014-07-15 MED ORDER — FAMOTIDINE IN NACL 20-0.9 MG/50ML-% IV SOLN
20.0000 mg | Freq: Once | INTRAVENOUS | Status: AC
Start: 2014-07-15 — End: 2014-07-15
  Administered 2014-07-15: 20 mg via INTRAVENOUS
  Filled 2014-07-15: qty 50

## 2014-07-15 MED ORDER — PHENYLEPHRINE 8 MG IN D5W 100 ML (0.08MG/ML) PREMIX OPTIME
INJECTION | INTRAVENOUS | Status: AC
  Filled 2014-07-15: qty 100

## 2014-07-15 MED ORDER — KETOROLAC TROMETHAMINE 30 MG/ML IJ SOLN
INTRAMUSCULAR | Status: AC
  Filled 2014-07-15: qty 1

## 2014-07-15 MED ORDER — LANOLIN HYDROUS EX OINT: 1.0000 "application " | TOPICAL_OINTMENT | CUTANEOUS | Status: DC | PRN

## 2014-07-15 MED ORDER — NALOXONE HCL 0.4 MG/ML IJ SOLN: 0.4000 mg | INTRAMUSCULAR | Status: DC | PRN

## 2014-07-15 MED ORDER — ONDANSETRON HCL 4 MG PO TABS: 4.0000 mg | ORAL_TABLET | ORAL | Status: DC | PRN

## 2014-07-15 MED ORDER — OXYTOCIN 40 UNITS IN LACTATED RINGERS INFUSION - SIMPLE MED
62.5000 mL/h | INTRAVENOUS | Status: AC
Start: 2014-07-15 — End: 2014-07-16

## 2014-07-15 MED ORDER — 0.9 % SODIUM CHLORIDE (POUR BTL) OPTIME
TOPICAL | Status: DC | PRN
  Administered 2014-07-15: 1000 mL

## 2014-07-15 SURGICAL SUPPLY — 36 items
APL SKNCLS STERI-STRIP NONHPOA (GAUZE/BANDAGES/DRESSINGS) ×1
BARRIER ADHS 3X4 INTERCEED (GAUZE/BANDAGES/DRESSINGS) IMPLANT
BENZOIN TINCTURE PRP APPL 2/3 (GAUZE/BANDAGES/DRESSINGS) ×1 IMPLANT
BLADE SURG 10 STRL SS (BLADE) ×4 IMPLANT
BRR ADH 4X3 ABS CNTRL BYND (GAUZE/BANDAGES/DRESSINGS)
CLAMP CORD UMBIL (MISCELLANEOUS) IMPLANT
CLOTH BEACON ORANGE TIMEOUT ST (SAFETY) ×2 IMPLANT
CONTAINER PREFILL 10% NBF 15ML (MISCELLANEOUS) IMPLANT
DRAPE LG THREE QUARTER DISP (DRAPES) IMPLANT
DRSG OPSITE POSTOP 4X10 (GAUZE/BANDAGES/DRESSINGS) ×2 IMPLANT
DURAPREP 26ML APPLICATOR (WOUND CARE) ×2 IMPLANT
ELECT REM PT RETURN 9FT ADLT (ELECTROSURGICAL) ×2
ELECTRODE REM PT RTRN 9FT ADLT (ELECTROSURGICAL) ×1 IMPLANT
EXTRACTOR VACUUM M CUP 4 TUBE (SUCTIONS) IMPLANT
GLOVE BIO SURGEON STRL SZ 6.5 (GLOVE) ×2 IMPLANT
GOWN STRL REUS W/TWL LRG LVL3 (GOWN DISPOSABLE) ×4 IMPLANT
KIT ABG SYR 3ML LUER SLIP (SYRINGE) IMPLANT
NDL HYPO 25X5/8 SAFETYGLIDE (NEEDLE) ×1 IMPLANT
NEEDLE HYPO 22GX1.5 SAFETY (NEEDLE) ×1 IMPLANT
NEEDLE HYPO 25X5/8 SAFETYGLIDE (NEEDLE) ×2 IMPLANT
NS IRRIG 1000ML POUR BTL (IV SOLUTION) ×2 IMPLANT
PACK C SECTION WH (CUSTOM PROCEDURE TRAY) ×2 IMPLANT
PAD OB MATERNITY 4.3X12.25 (PERSONAL CARE ITEMS) ×2 IMPLANT
STAPLER VISISTAT 35W (STAPLE) IMPLANT
STRIP CLOSURE SKIN 1/2X4 (GAUZE/BANDAGES/DRESSINGS) ×2 IMPLANT
SUT CHROMIC 0 CTX 36 (SUTURE) ×6 IMPLANT
SUT PLAIN 0 NONE (SUTURE) IMPLANT
SUT PLAIN 2 0 XLH (SUTURE) IMPLANT
SUT VIC AB 0 CT1 27 (SUTURE) ×6
SUT VIC AB 0 CT1 27XBRD ANBCTR (SUTURE) ×3 IMPLANT
SUT VIC AB 4-0 KS 27 (SUTURE) IMPLANT
SYRINGE CONTROL L 12CC (SYRINGE) IMPLANT
SYRINGE CONTROL LL 12CC (SYRINGE) IMPLANT
TOWEL OR 17X24 6PK STRL BLUE (TOWEL DISPOSABLE) ×2 IMPLANT
TRAY FOLEY CATH 14FR (SET/KITS/TRAYS/PACK) ×2 IMPLANT
WATER STERILE IRR 1000ML POUR (IV SOLUTION) ×2 IMPLANT

## 2014-07-15 NOTE — Anesthesia Procedure Notes (Signed)

## 2014-07-15 NOTE — Transfer of Care (Signed)
Immediate Anesthesia Transfer of Care Note  Patient: Kim [name]  Procedure(s) Performed: Procedure(s) with comments: PRIMARY CESAREAN SECTION (N/A) - [facility] 08/02/14  Patient Location: PACU  Anesthesia Type:Spinal  Level of Consciousness: awake, alert  and oriented  Airway & Oxygen Therapy: Patient Spontanous Breathing  Post-op Assessment: Report given to PACU RN and Post -op Vital signs reviewed and stable  Post vital signs: Reviewed and stable  Complications: No apparent anesthesia complications

## 2014-07-15 NOTE — Lactation Note (Signed)
This note was copied from the chart of Kim Quantina Dershem. Lactation Consultation Note Initial visit at 5 hours of age.  MBU RN request assist with nipple shield sizing.  Mom is using a # 16 and denies pain with latch.  Baby had already finished feeding at my visit.  Nipple appear erect at this time and mom reports nipple has pulled out some.  Encouraged mom to continue to use as needed, but may consider using DEBP if she continues to need nipple shield.  Baby is STS on moms chest now.   [facility] LC resources given and discussed.  Encouraged to feed with early cues on demand.  Early newborn behavior discussed.  Hand expression demonstrated with colostrum visible.  Mom to call for assist as needed.    Patient Name: Kim [name] [id number] Date: 07/15/2014 Reason for consult: Initial assessment   Maternal Data Has patient been taught Hand Expression?: Yes Does the patient have breastfeeding experience prior to this delivery?: No  Feeding Feeding Type: Breast Fed Length of feed: 10 min  LATCH Score/Interventions Latch: Too sleepy or reluctant, no latch achieved, no sucking elicited. Intervention(s): Skin to skin  Audible Swallowing: None Intervention(s): Skin to skin  Type of Nipple: Everted at rest and after stimulation  Comfort (Breast/Nipple): Soft / non-tender     Hold (Positioning): Full assist, staff holds infant at breast Intervention(s): Breastfeeding basics reviewed;Support Pillows;Position options;Skin to skin  LATCH Score: 4  Lactation Tools Discussed/Used Tools: Nipple [name] Nipple shield size: 16   Consult Status Consult Status: Follow-up Date: 07/16/14 Follow-up type: In-patient    Kim [name] 07/15/2014, 10:54 PM

## 2014-07-15 NOTE — MAU Note (Signed)
Pt sent over from office for add on c-section. Breech presentation low fluid and elevated b/p's

## 2014-07-15 NOTE — Anesthesia Preprocedure Evaluation (Signed)
Anesthesia Evaluation  Patient identified by MRN, date of birth, ID band Patient awake    Reviewed: Allergy & Precautions, H&P , Patient's Chart, lab work & pertinent test results  Airway Mallampati: II  TM Distance: >3 FB Neck ROM: full    Dental no notable dental hx.    Pulmonary former smoker,  breath sounds clear to auscultation  Pulmonary exam normal       Cardiovascular Exercise Tolerance: Good Rhythm:regular Rate:Normal     Neuro/Psych    GI/Hepatic   Endo/Other    Renal/GU      Musculoskeletal   Abdominal   Peds  Hematology   Anesthesia Other Findings   Reproductive/Obstetrics                             Anesthesia Physical Anesthesia Plan  ASA: II  Anesthesia Plan: Spinal   Post-op Pain Management:    Induction:   Airway Management Planned:   Additional Equipment:   Intra-op Plan:   Post-operative Plan:   Informed Consent: I have reviewed the patients History and Physical, chart, labs and discussed the procedure including the risks, benefits and alternatives for the proposed anesthesia with the patient or authorized representative who has indicated his/her understanding and acceptance.   Dental Advisory Given  Plan Discussed with: CRNA  Anesthesia Plan Comments: (Lab work confirmed with CRNA in room. Platelets okay. Discussed spinal anesthetic, and patient consents to the procedure:  included risk of possible headache,backache, failed block, allergic reaction, and nerve injury. This patient was asked if she had any questions or concerns before the procedure started. )        Anesthesia Quick Evaluation  

## 2014-07-15 NOTE — Anesthesia Postprocedure Evaluation (Signed)
  Anesthesia Post-op Note  Patient: Kim [name]  Procedure(s) Performed: Procedure(s) with comments: PRIMARY CESAREAN SECTION (N/A) - EDC 08/02/14  Patient is awake, responsive, moving her legs, and has signs of resolution of her numbness. Pain and nausea are reasonably well controlled. Vital signs are stable and clinically acceptable. Oxygen saturation is clinically acceptable. There are no apparent anesthetic complications at this time. Patient is ready for discharge.

## 2014-07-15 NOTE — H&P (Signed)
Kim [name] is a 28 y.o. G 1 P 0 at 37 weeks and 3 days presents for C Section. BP 130/94 today and 124/96 yesterday. Baby breech and AFI less than third percentile. History OB History   Grav Para Term Preterm Abortions TAB SAB Ect Mult Living   1 0 0       2     Past Medical History  Diagnosis Date  . Medical history non-contributory    Past Surgical History  Procedure Laterality Date  . Vulva /perineum biopsy  12/2003    Condyloma  . Breast enhancement surgery  11/10  . No past surgeries     Family History: family history is not on file. Social History:  reports that she has quit smoking. She does not have any smokeless tobacco history on file. She reports that she does not drink alcohol or use illicit drugs.   Prenatal Transfer Tool  Maternal Diabetes: No Genetic Screening: Normal Maternal Ultrasounds/Referrals: Normal Fetal Ultrasounds or other Referrals:  None Maternal Substance Abuse:  No Significant Maternal Medications:  None Significant Maternal Lab Results:  None Other Comments:  None  ROS    There were no vitals taken for this visit. Maternal Exam:  Abdomen: Fetal presentation: breech     Physical Exam  Nursing note and vitals reviewed. Constitutional: She appears well-developed.  Eyes: Pupils are equal, round, and reactive to light.  Neck: Normal range of motion.  Cardiovascular: Normal rate and regular rhythm.   Respiratory: Effort normal.    Prenatal labs: ABO, Rh:   Antibody:   Rubella:   RPR:    HBsAg:    HIV:    GBS:     Assessment/Plan: Gestational hypertension Breech Borderline Low AFI Primary LTCS Risks reviewed Consent signed   Keighley Deckman L 07/15/2014, 12:40 PM

## 2014-07-15 NOTE — Brief Op Note (Signed)
07/15/2014  5:58 PM  PATIENT:  Colbert Ewing  28 y.o. female  PRE-OPERATIVE DIAGNOSIS:  BREECH, IUP at 64 w 3 days, Gestational Hypertension, borderline low AFI  POST-OPERATIVE DIAGNOSIS:  Same  PROCEDURE:  Procedure(s) with comments: PRIMARY CESAREAN SECTION (N/A) - EDC 08/02/14  SURGEON:  Surgeon(s) and Role:    * Jeani Hawking, MD - Primary  PHYSICIAN ASSISTANT:   ASSISTANTS: none   ANESTHESIA:   spinal  EBL:  Total I/O In: -  Out: 500 [Blood:500]  BLOOD ADMINISTERED:none  DRAINS: Urinary Catheter (Foley)   LOCAL MEDICATIONS USED:  NONE  SPECIMEN:  No Specimen  DISPOSITION OF SPECIMEN:  N/A  COUNTS:  YES  TOURNIQUET:  * No tourniquets in log *  DICTATION: .Other Dictation: Dictation Number [id number]  PLAN OF CARE: Admit to inpatient   PATIENT DISPOSITION:  PACU - hemodynamically stable.   Delay start of Pharmacological VTE agent (>24hrs) due to surgical blood loss or risk of bleeding: not applicable

## 2014-07-16 LAB — CBC
HCT: 29.1 % — ABNORMAL LOW (ref 36.0–46.0)
Hemoglobin: 9.9 g/dL — ABNORMAL LOW (ref 12.0–15.0)
MCH: 32.1 pg (ref 26.0–34.0)
MCHC: 33.7 g/dL (ref 30.0–36.0)
MCV: 95.4 fL (ref 78.0–100.0)
PLATELETS: 147 10*3/uL — AB (ref 150–400)
RBC: 3.05 MIL/uL — ABNORMAL LOW (ref 3.87–5.11)
RDW: 13.4 % (ref 11.5–15.5)
WBC: 7.2 10*3/uL (ref 4.0–10.5)

## 2014-07-16 LAB — RPR

## 2014-07-16 LAB — OB RESULTS CONSOLE RPR: RPR: NONREACTIVE

## 2014-07-16 NOTE — Op Note (Signed)
Kim [name], Kim [name]             ACCOUNT NO.:  [id number]  MEDICAL RECORD NO.:  [id number]  LOCATION:  9126                          FACILITY:  [facility]  PHYSICIAN:  Mariame Rybolt L. Kelley Polinsky, M.D.DATE OF BIRTH:  1986-02-05  DATE OF PROCEDURE: DATE OF DISCHARGE:                              OPERATIVE REPORT   PREOPERATIVE DIAGNOSIS:  IUP at 37 weeks and 3 days, gestational hypertension, borderline low amniotic fluid, and breech presentation.  POSTOPERATIVE DIAGNOSIS:  IUP at 37 weeks and 3 days, gestational hypertension, borderline low amniotic fluid, and breech presentation.  PROCEDURE:  Primary low transverse cesarean section.  SURGEON:  Aspen Lawrance L. Vincente Poli, M.D.  ANESTHESIA:  Spinal.  ESTIMATED BLOOD LOSS:  Less than 500.  COMPLICATIONS:  None.  DRAINS:  Foley catheter.  PROCEDURE IN DETAIL:  The patient was taken to the operating room.  She was administered anesthesia. She was prepped and draped in the standard fashion.  Low transverse incision was made and carried down to the fascia.  Fascia scored in midline and extended laterally.  Rectus muscles were separated in midline.  The peritoneum was entered bluntly. The peritoneal incision was then stretched.  The lower uterine segment was identified.  The bladder flap was created sharply and then digitally.  The bladder blade was then readjusted.  A low transverse incision was made in the uterus.  Uterus was entered using a hemostat. The baby was in frank breech presentation, was delivered easily was a female infant, Apgars 7 at 1 minute and 9 at 5 minutes.  The cord was clamped and cut.  The baby was handed to the awaiting neonatal team. The cord was clamped and cut.  The placenta was manually removed and noted to be normal intact with a three-vessel cord.  The uterus was exteriorized and cleared of all clots and debris.  The uterine incision was closed in 2 layers using 0 chromic in a running, locked stitch. Hemostasis was  excellent.  Irrigation was performed.  The peritoneum was closed using 0 Vicryl in a running stitch.  The fascia was closed using 0 Vicryl starting each corner an d meeting in the midline with a running stitch.  The skin was closed with 4-0 Vicryl on a Keith needle. All sponge, lap, and instrument counts were correct x2.  The patient went to recovery room stable condition.     Lynnix Schoneman L. Vincente Poli, M.D.     Florestine Avers  D:  07/15/2014  T:  07/16/2014  Job:  [id number]

## 2014-07-16 NOTE — Addendum Note (Signed)
Addendum created 07/16/14 1042 by Elbert Ewings, CRNA   Modules edited: Notes Section   Notes Section:  File: [id number]

## 2014-07-16 NOTE — Anesthesia Postprocedure Evaluation (Signed)
  Anesthesia Post-op Note  Patient: Kim [name]  Procedure(s) Performed: Procedure(s) with comments: PRIMARY CESAREAN SECTION (N/A) - EDC 08/02/14  Patient Location: PACU and Mother/Baby  Anesthesia Type:Spinal  Level of Consciousness: awake, alert  and oriented  Airway and Oxygen Therapy: Patient Spontanous Breathing  Post-op Pain: mild  Post-op Assessment: Patient's Cardiovascular Status Stable, Respiratory Function Stable, No signs of Nausea or vomiting, Adequate PO intake and Pain level controlled  Post-op Vital Signs: Reviewed and stable  Last Vitals:  Filed Vitals:   07/16/14 0515  BP: 112/57  Pulse: 72  Temp: 37.2 C  Resp: 20    Complications: No apparent anesthesia complications

## 2014-07-16 NOTE — Progress Notes (Signed)
Subjective: Postpartum Day 1: Cesarean Delivery Patient reports tolerating PO.    Objective: Vital signs in last 24 hours: Temp:  [97.9 F (36.6 C)-99 F (37.2 C)] 99 F (37.2 C) (08/29 0515) Pulse Rate:  [66-98] 72 (08/29 0515) Resp:  [13-30] 20 (08/29 0515) BP: (98-130)/(54-90) 112/57 mmHg (08/29 0515) SpO2:  [96 %-100 %] 98 % (08/29 0515) Weight:  [92.987 kg (205 lb)] 92.987 kg (205 lb) (08/28 1616)  Physical Exam:  General: alert, cooperative and appears stated age Lochia: appropriate Uterine Fundus: firm Incision: healing well, no significant drainage, no dehiscence, no significant erythema DVT Evaluation: No evidence of DVT seen on physical exam.   Recent Labs  07/15/14 1620 07/16/14 0624  HGB 11.8* 9.9*  HCT 33.7* 29.1*    Assessment/Plan: Status post Cesarean section. Doing well postoperatively.  Continue current care.  Ahmia Colford L 07/16/2014, 8:04 AM

## 2014-07-17 LAB — CBC
HCT: 29.5 % — ABNORMAL LOW (ref 36.0–46.0)
HEMOGLOBIN: 9.9 g/dL — AB (ref 12.0–15.0)
MCH: 32.9 pg (ref 26.0–34.0)
MCHC: 33.6 g/dL (ref 30.0–36.0)
MCV: 98 fL (ref 78.0–100.0)
PLATELETS: 164 10*3/uL (ref 150–400)
RBC: 3.01 MIL/uL — ABNORMAL LOW (ref 3.87–5.11)
RDW: 13.5 % (ref 11.5–15.5)
WBC: 7.1 10*3/uL (ref 4.0–10.5)

## 2014-07-17 NOTE — Lactation Note (Signed)
This note was copied from the chart of Kim [name]. Lactation Consultation Note  P1, Breast implants.  Mother states she had implants because her breasts were small.  She states they were not widely spaced. Reviewed hand expression and mother was able to hand express drops of colostrum on right side. She expressed glistening of colostrum on left breast.  Mother is concerned that she does not have enough breastmilk. Baby cluster fed last night. Assisted with latching baby without NS on right breast.  Sucks and a few swallows observed and baby fell asleep. Nipple pinched after feeding and mother is tender.   Repositioned baby to cross cradle and reviewed how to compress breast to achieve a deeper latch.  Baby relatched for 10 more minutes and fell asleep for a feeding total time of 18 min. Maternal grandmother concerned mother is not sleeping enough and wants baby to be put in crib. Discussed new babies transition gradually to crib and signs whether baby is getting enough breastmilk. Reviewed monitoring voids/stools, listening for swallows, satiety after feedings. Assisted mother in pumping.  Suggested she pump for 15-20 min at least 4-6 times a day to stimulate her milk supply. Encouraged her to call if she needs further assistance.   Patient Name: Kim [name] [id number]'V Date: 07/17/2014 Reason for consult: Follow-up assessment   Maternal Data Formula Feeding for Exclusion: Yes Reason for exclusion: Previous breast surgery (mastectomy, reduction, or augmentation where mother is unable to produce breast milk) Has patient been taught Hand Expression?: Yes Does the patient have breastfeeding experience prior to this delivery?: No  Feeding Feeding Type: Breast Fed  LATCH Score/Interventions Latch: Grasps breast easily, tongue down, lips flanged, rhythmical sucking. Intervention(s): Skin to skin Intervention(s): Adjust position;Assist with latch;Breast massage;Breast  compression  Audible Swallowing: A few with stimulation Intervention(s): Hand expression Intervention(s): Hand expression;Alternate breast massage;Skin to skin  Type of Nipple: Everted at rest and after stimulation Intervention(s): Double electric pump;Shells  Comfort (Breast/Nipple): Filling, red/small blisters or bruises, mild/mod discomfort  Problem noted: Mild/Moderate discomfort Interventions (Mild/moderate discomfort): Hand expression  Hold (Positioning): Assistance needed to correctly position infant at breast and maintain latch. Intervention(s): Breastfeeding basics reviewed;Support Pillows;Position options;Skin to skin  LATCH Score: 7  Lactation Tools Discussed/Used     Consult Status Consult Status: Follow-up Date: 07/18/14 Follow-up type: In-patient    Dahlia Byes [facility] 07/17/2014, 12:59 PM

## 2014-07-17 NOTE — Progress Notes (Signed)
Subjective: Postpartum Day 2: Cesarean Delivery Patient reports incisional pain, tolerating PO and no problems voiding.    Objective: Vital signs in last 24 hours: Temp:  [98 F (36.7 C)-98.9 F (37.2 C)] 98.2 F (36.8 C) (08/30 0713) Pulse Rate:  [61-71] 61 (08/30 0713) Resp:  [16-20] 19 (08/30 0713) BP: (100-109)/(59-66) 109/61 mmHg (08/30 0713) SpO2:  [98 %] 98 % (08/30 0713)  Physical Exam:  General: alert, cooperative and appears stated age Lochia: appropriate Uterine Fundus: firm Incision: healing well, no significant drainage, no dehiscence, no significant erythema DVT Evaluation: No evidence of DVT seen on physical exam.   Recent Labs  07/16/14 0624 07/17/14 0551  HGB 9.9* 9.9*  HCT 29.1* 29.5*    Assessment/Plan: Status post Cesarean section. Doing well postoperatively.  Continue current care.  Keyanna Sandefer L 07/17/2014, 8:21 AM

## 2014-07-18 ENCOUNTER — Encounter (HOSPITAL_COMMUNITY): Payer: Self-pay | Admitting: Obstetrics and Gynecology

## 2014-07-18 MED ORDER — OXYCODONE-ACETAMINOPHEN 5-325 MG PO TABS: 1.0000 | ORAL_TABLET | ORAL | Status: DC | PRN

## 2014-07-18 MED ORDER — IBUPROFEN 600 MG PO TABS: 600.0000 mg | ORAL_TABLET | Freq: Four times a day (QID) | ORAL | Status: DC

## 2014-07-18 NOTE — Discharge Summary (Signed)
Obstetric Discharge Summary Reason for Admission: cesarean section Prenatal Procedures: ultrasound Intrapartum Procedures: cesarean: low cervical, transverse Postpartum Procedures: none Complications-Operative and Postpartum: none Hemoglobin  Date Value Ref Range Status  07/17/2014 9.9* 12.0 - 15.0 g/dL Final     HCT  Date Value Ref Range Status  07/17/2014 29.5* 36.0 - 46.0 % Final    Physical Exam:  General: alert and cooperative Lochia: appropriate Uterine Fundus: firm Incision: honeycomb dressing CDI DVT Evaluation: No evidence of DVT seen on physical exam. Negative Homan's sign. No cords or calf tenderness. Calf/Ankle edema is present.  Discharge Diagnoses: Term Pregnancy-delivered  Discharge Information: Date: 07/18/2014 Activity: pelvic rest Diet: routine Medications: PNV, Ibuprofen and Percocet Condition: stable Instructions: refer to practice specific booklet Discharge to: home   Newborn Data: Live born female  Birth Weight: 7 lb 6.5 oz (3359 g) APGAR: 8, 9  Home with mother.  Takina Busser G 07/18/2014, 8:34 AM

## 2014-07-18 NOTE — Lactation Note (Signed)
This note was copied from the chart of Kim [name]. Lactation Consultation Note Baby had 9% weight loss. RN reported mom's breast was filling. Mom has breast implants and states her breast has gotten much bigger since this morning. Mom has generalized body edema. Breast feels heavy, encouraged breast massage and ice. Mom has positional stripes and cracks bilatterally from BF first day w/o NS. Mom is wearing supportive nursing bra w/NS. Has comfort gels, and #20 NS. Mom was trying to latch baby w/o NS. Asked why didn't she want to latch w/NS. Mom stated "I was just going to see if I could. Showed picture of pg. 24 on Baby and Me bood of shallow latch and explained that's why mom has sore nipples. Explained about milk transfer and baby had 9% weight loss. Gave the late pre-term information sheet and suggested supplementing using SNS. Mom stated ok, she didn't want to use a bottle. SNS set up w/similac formula and baby fed well to breast. Reviewed feeding guide lines according to hours of age on the LPI sheet. Mom encouraged to feed baby 8-12 times/24 hours and with feeding cues. Mom reports + breast changes w/pregnancy. Reviewed Baby & Me book's Breastfeeding Basics. Mom shown how to use DEBP & how to disassemble, clean, & reassemble parts.Hand expression taught to Mom.  Educated about newborn behavior of LPI. Mom taught how to apply & clean nipple shield. Mom knows to pump q3h for 10-15 min. To stimulate breast d/t implants. Encouraged comfort during BF so colostrum flows better and mom will enjoy the feeding longer. Taking deep breaths and breast massage during BF. Mom encouraged to do skin-to-skin during feedings.  Patient Name: Kim Corliss Coggeshall ZHYQM'V Date: 07/18/2014 Reason for consult: Follow-up assessment;Difficult latch;Late preterm infant;Other (Comment);Infant weight loss (breast implants)   Maternal Data    Feeding Feeding Type: Other (comment) (breast/SNS) Length of feed: 15  min  LATCH Score/Interventions Latch: Repeated attempts needed to sustain latch, nipple held in mouth throughout feeding, stimulation needed to elicit sucking reflex. Intervention(s): Waking techniques Intervention(s): Adjust position;Assist with latch;Breast massage;Breast compression  Audible Swallowing: A few with stimulation Intervention(s): Hand expression Intervention(s): Alternate breast massage;Hand expression  Type of Nipple: Everted at rest and after stimulation (small nipples/no shaft) Intervention(s): Shells;Double electric pump  Comfort (Breast/Nipple): Filling, red/small blisters or bruises, mild/mod discomfort  Problem noted: Cracked, bleeding, blisters, bruises;Filling Interventions (Filling): Massage;Firm support;Frequent nursing;Double electric pump Interventions (Mild/moderate discomfort): Comfort gels;Breast shields;Post-pump;Hand expression;Hand massage;Reverse pressue  Hold (Positioning): Assistance needed to correctly position infant at breast and maintain latch. Intervention(s): Breastfeeding basics reviewed;Support Pillows;Position options  LATCH Score: 6  Lactation Tools Discussed/Used Tools: Shells;Nipple [name];Pump;Supplemental Nutrition System;Comfort gels Nipple shield size: 20 Shell Type: Inverted Breast pump type: Double-Electric Breast Pump Pump Review: Setup, frequency, and cleaning;Milk Storage Initiated by:: RN   Consult Status Consult Status: Follow-up Date: 07/18/14 Follow-up type: In-patient    Charyl Dancer 07/18/2014, 4:17 AM

## 2014-07-21 ENCOUNTER — Ambulatory Visit (HOSPITAL_COMMUNITY)
Admit: 2014-07-21 | Discharge: 2014-07-21 | Disposition: A | Discharge status: Home / Self Care | Payer: PRIVATE HEALTH INSURANCE | Attending: Obstetrics and Gynecology | Admitting: Obstetrics and Gynecology

## 2014-07-21 NOTE — Lactation Note (Addendum)
Lactation Consult  Mother's reason for visit:  Feeding assessment and weight check Visit Type:  outpatient Appointment Notes:  Baby was born on Friday, [birth year]. Mom/baby were discharged on 07/18/14 but by evening baby had not voided for 24 hours so parents called Peds and were instructed to increase frequency of BF and start supplements of Similac formula 15-30 ml after BF for 15 minutes every 2 hours. Went to [employer] on Tuesday for weight check. Since that time Mom reports her milk has come in and baby is nursing more frequently, cluster feeding at night and the voids have increased. Mom has not supplemented baby in the past 24 hours. Mom has been using #24 nipple shield to latch. She started using nipple shield in the hospital due to nipple trauma/pain. This has since improved. Baby now 83 days old. Mom reports she feels BF going better since her milk has come in.  Consult:  Initial Lactation Consultant:  Kim [name]  ________________________________________________________________________  Baby's Name: Kim [name]  Date of Birth: 07/15/2015  Pediatrician: Dr. Orvan Falconer, [facility] Gender: female  Gestational Age: [redacted]w[redacted]d (At Birth)  Birth Weight: 7 lb 6.5 oz (3359 g)  Weight at Discharge: Weight: 6 lb 12.1 oz (3065 g) Date of Discharge: 07/18/2014  [facility] Weights   07/15/14 1740 07/17/14 0100 07/17/14 2312  Weight: 7 lb 6.5 oz (3359 g) 6 lb 15.5 oz (3160 g) 6 lb 12.1 oz (3065 g)  Last weight taken from location outside of [facility]: 6 lb. 12.0 oz on Tuesday, 07/19/14 Location:Pediatrician's office  Weight today: 6 lb. 13.5 oz/3106 gm without diaper, indicating a 1.5 oz weight gain in 2 days.    ________________________________________________________________________  Mother's Name: Kim [name] Type of delivery:  Primary C/S for breech Breastfeeding Experience:  None Maternal Medical Conditions:  Breast augmentation, Allergies Maternal Medications:  Zyrtec QD, Motrin prn,  PNV  ________________________________________________________________________  Breastfeeding History (Post Discharge)  Frequency of breastfeeding:  At least every 3 hours Duration of feeding:  15-45 minutes    Pumping  Type of pump:  Medela Freestyle Frequency:  2 times/day Volume:   2 oz ml combined from both breasts.   Infant Intake and Output Assessment  Voids:  4 in 24 hrs.  Color:  Clear yellow Stools:  3 in 24 hrs.  Color:  Green, watery  ________________________________________________________________________  Maternal Breast Assessment  Breast:  Full, some nodule in outer quadrants, breast soften with nursing, implants present.  Nipple:  Erect bilateral. Positional stripes bilateral from previous trauma Pain level:  0 Pain interventions:  N/A, using nipple shield to help w/latch   _______________________________________________________________________ Feeding Assessment/Evaluation  Initial feeding assessment:  Infant's oral assessment:  WNL  Positioning:  Cross cradle, LC assisted Mom with positioning to obtain more depth with latch. Changed nipple shield size for better fit.  Left breast  LATCH documentation:  Latch:  2 = Grasps breast easily, tongue down, lips flanged, rhythmical sucking. Using nipple shield to latch.  Audible swallowing:  2 = Spontaneous and intermittent  Type of nipple:  2 = Everted at rest and after stimulation  Comfort (Breast/Nipple):  1 = Filling, red/small blisters or bruises, mild/mod discomfort. Breast full, milk is in, not engorged. Positional stripe   Hold (Positioning):  1 = Assistance needed to correctly position infant at breast and maintain latch  LATCH score:  8  Attached assessment:  Deep  Lips flanged:  Yes.    Lips untucked:  Yes.    Suck assessment:  Nutritive  Tools:  Nipple shield 20 mm, breast shells. Mom requested another pair of breast shells at this visit.  Instructed on use and cleaning of tool:  Yes.     Pre-feed weight:  3126 g  (6 lb. 14.2 oz.) Post-feed weight:  3156 g (6 lb. 15.3 oz.) Amount transferred:  30 ml Amount supplemented:  0 ml  Additional Feeding Assessment -   Infant's oral assessment:  WNL  Positioning:  Cross cradle, LC worked with Mom to obtain more depth w/latch. Mom has some difficulty due to baby's legs upward. She was breech.  Right breast  LATCH documentation:  Latch:  2 = Grasps breast easily, tongue down, lips flanged, rhythmical sucking. Using #20 nipple shield  Audible swallowing:  2 = Spontaneous and intermittent  Type of nipple:  2 = Everted at rest and after stimulation, positional stripes bilateral  Comfort (Breast/Nipple):  1 = Filling, red/small blisters or bruises, mild/mod discomfort. Full but not engorged. Some nodules present in outer quadrants, breasts softening with nursing.   Hold (Positioning):  1 = Assistance needed to correctly position infant at breast and maintain latch  LATCH score:  8  Attached assessment:  Deep  Lips flanged:  Yes.    Lips untucked:  Yes  Suck assessment:  Nutritive  Tools:  Nipple shield 20 mm Instructed on use and cleaning of tool:  Yes.    Pre-feed weight:  3156 g  (6 lb. 15.3 oz.) Post-feed weight:  3174 g (7 lb. 0 oz.) Amount transferred: 18 ml with nursing off/on for 30 minutes Amount supplemented:  0 ml   Total amount pumped post feed:  R 0 ml    L 0 ml  Mom did not pump at this visit  Total amount transferred:  48 ml Total supplement given:  0 ml  LC encouraged Mom to pre-pump for 3-5 minutes to soften aerola to help baby latch with or without the nipple shield. Advised parents this will help with latch since Mom's breasts are full and firm with implants. Baby will also get higher fat content milk while breastfeeding. Advised baby should be at the breast 8-12 times in 24 hours for at least 15-20 minutes each breast. Both breasts each feeding. Lots of breast milk visible in nipple shield at this visit  with feedings.  Advised parents baby volume per feeding should be around 60 ml each feeding for now.  Advised to give baby back any EBM she receives with pre-pumping or post pumping. Advised stools should be changing to yellow now that milk has come in unless baby getting too much foremilk. Advised baby should be having 6 wet diapers per day now that she is 66 days old.  Advised Mom to massage breasts while nursing to help empty breast well.  If nodules present, apply warm compress prior to pumping/BF and massage while pumping/breastfeeding to help empty breasts and soften nodule. Stressed importance of not becoming engorged with breast implants as this increases her risk for clogged milk duct/mastitis.  Mom did not make OP f/u appointment. Encouraged to come to support group next week for weight check.

## 2014-07-26 ENCOUNTER — Inpatient Hospital Stay (HOSPITAL_COMMUNITY): Admission: RE | Admit: 2014-07-26 | Payer: PRIVATE HEALTH INSURANCE | Source: Ambulatory Visit

## 2014-08-19 ENCOUNTER — Telehealth: Payer: Self-pay | Admitting: *Deleted

## 2014-08-19 NOTE — Telephone Encounter (Signed)
Called pt to schedule AEX appt. Recall 8. Patient had C-Section 8/28. She had a visit with OB today and they did a pap.  Dr. Farrel GobbleLathrop Please advise.   ZO:XWRUECC:Sally

## 2014-09-08 NOTE — Telephone Encounter (Signed)
LM for pt to call back re: scheduling AEX. Is she under OB care still?

## 2014-09-08 NOTE — Telephone Encounter (Signed)
S/W pt. She stated she is still under her OB office. She states she will stay there since she wants more children.

## 2014-09-09 NOTE — Telephone Encounter (Signed)
Patient transferred care. - Recall Completed. Dr. Hyacinth MeekerMiller  CC: Kim [name]

## 2014-09-19 ENCOUNTER — Encounter (HOSPITAL_COMMUNITY): Payer: Self-pay | Admitting: Obstetrics and Gynecology

## 2015-04-17 IMAGING — US US FETAL BPP W/O NONSTRESS
1 series · 13 of 28 positions shown · non-contrast
Comparison: none

[Series 1: us fetal bpp w/o nonstress · non-contrast · 34 acquisitions, 13 frames shown]
[im 2/34]
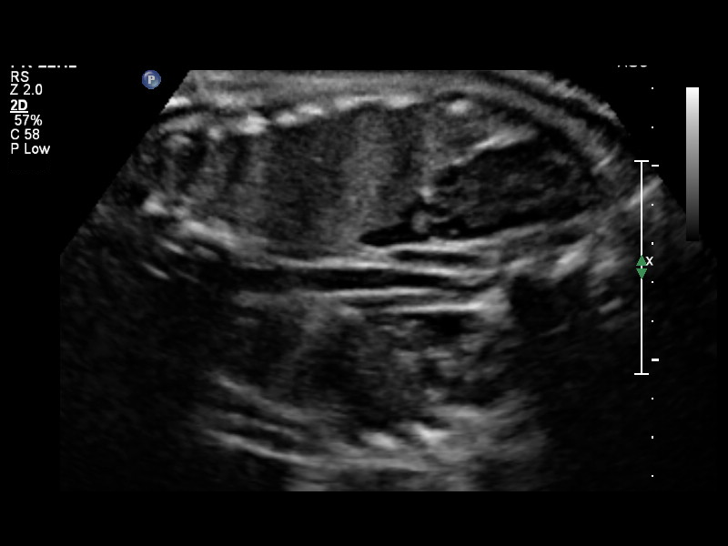
[im 4/34]
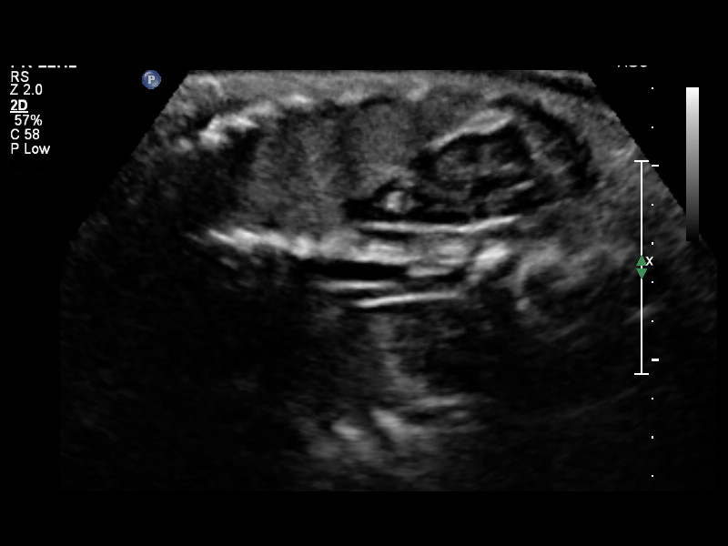
[im 7/34]
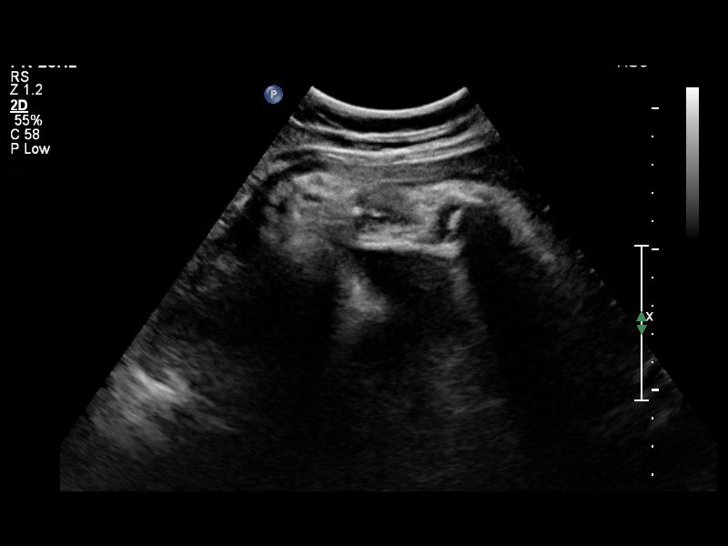
[im 9/34]
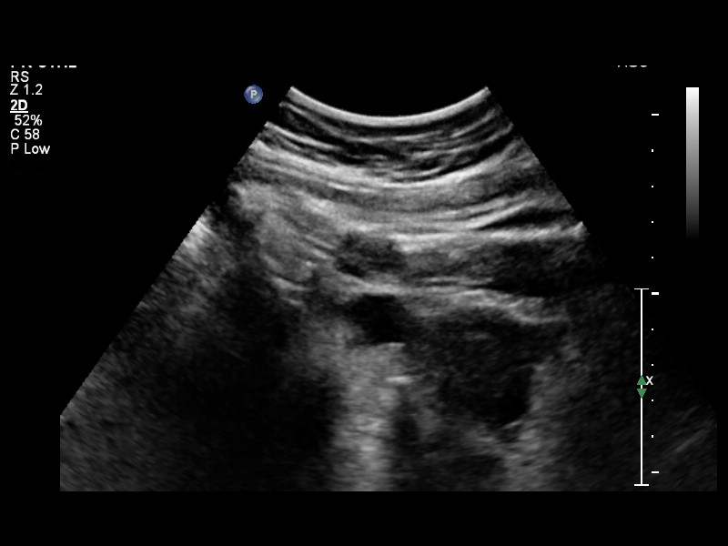
[im 12/34]
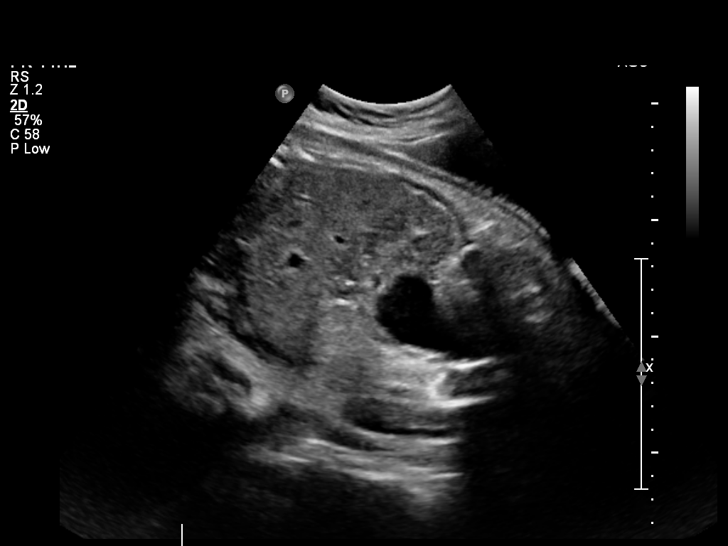
[im 14/34]
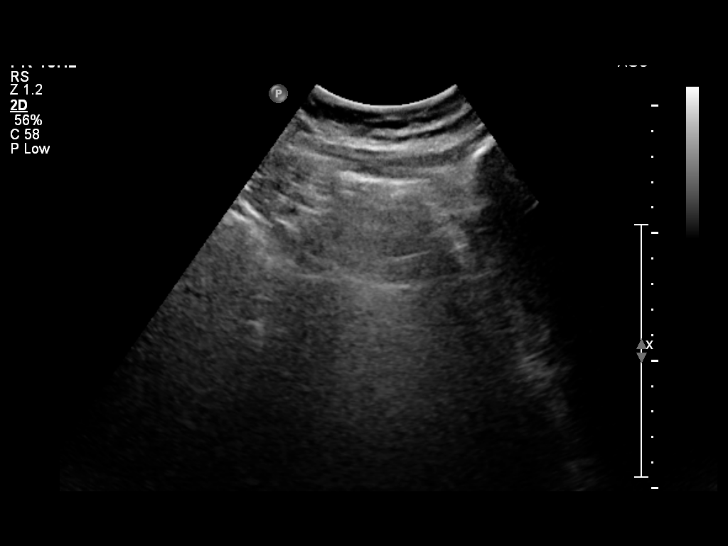
[im 18/34]
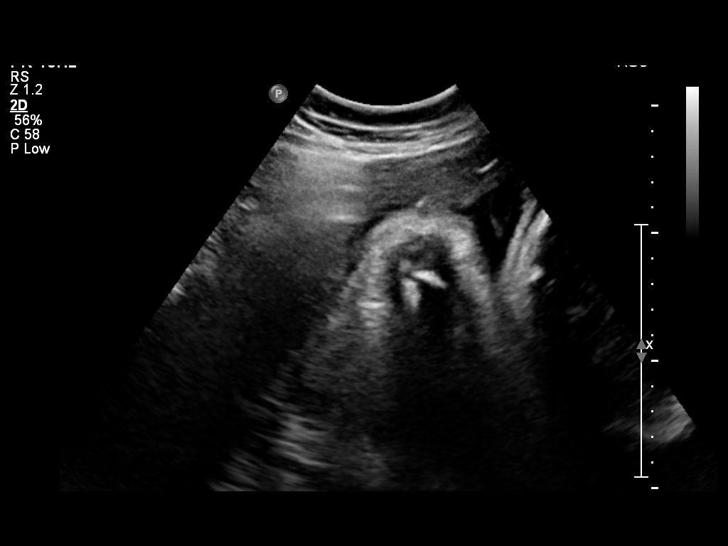
[im 20/34]
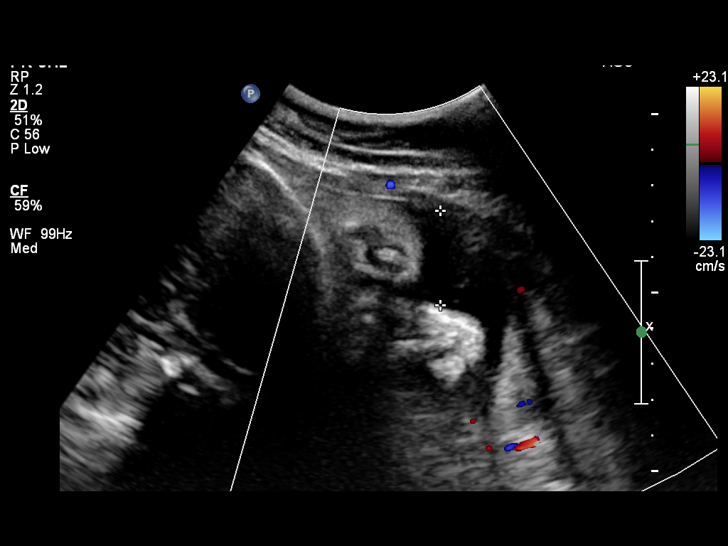
[im 23/34]
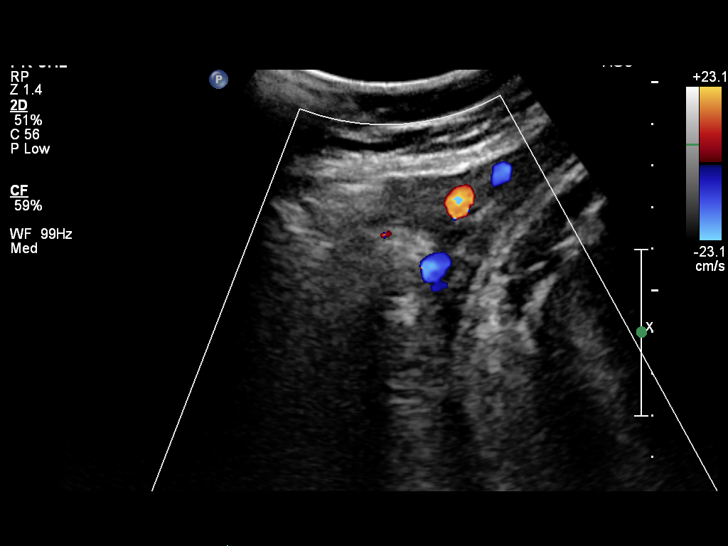
[im 25/34]
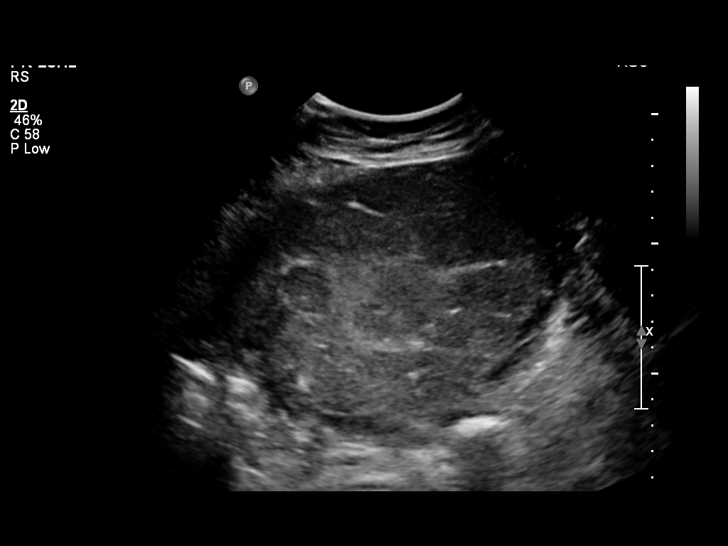
[im 27/34]
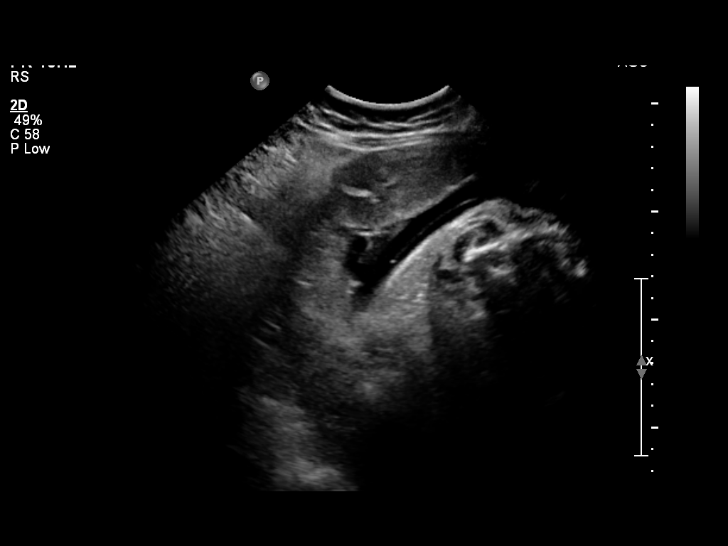
[im 30/34]
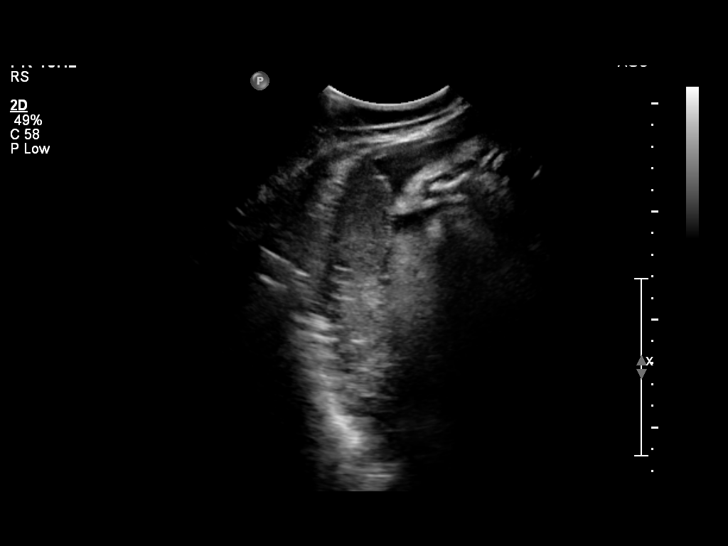
[im 32/34]
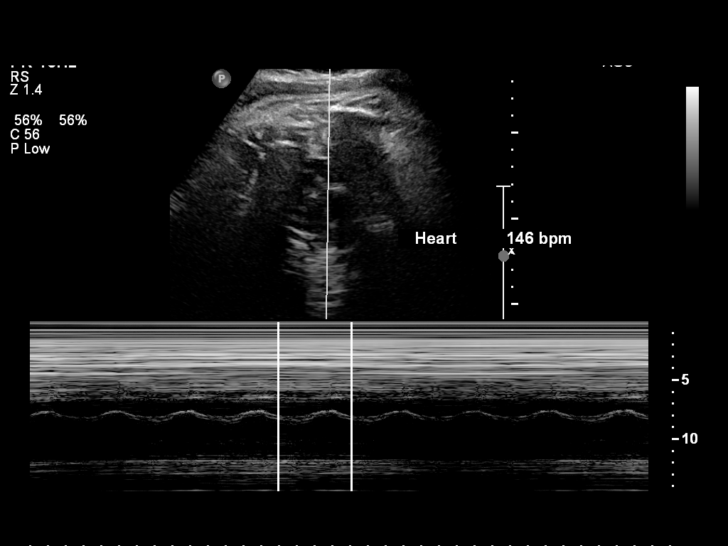

[13 of 28 positions shown; findings below may reference images not displayed]

OBSTETRICS REPORT

Service(s) Provided

 [HOSPITAL]                                         76815.0
Indications

 Non-reactive NST, FHR decelerations
Fetal Evaluation

 Num Of Fetuses:    1
 Fetal Heart Rate:  146                          bpm
 Cardiac Activity:  Observed
 Presentation:      Breech
 Placenta:          Posterior

 Amniotic Fluid
 AFI FV:      Subjectively within normal limits
 AFI Sum:     5.07     cm     < 3  %Tile      Larg Pckt:   2.64  cm
 LUQ:   2.64    cm   LLQ:    2.43   cm
Biophysical Evaluation

 Amniotic F.V:   Pocket => 2 cm two         F. Tone:         Observed
                 planes
 F. Movement:    Observed                   Score:           [DATE]
 F. Breathing:   Observed
Gestational Age

 Clinical EDD:  37w 2d                                        EDD:    08/02/14
 Best:          37w 2d     Det. By:  Clinical EDD             EDD:    08/02/14
Cervix Uterus Adnexa

 Cervix:       Not visualized (advanced GA >19wks)
 Uterus:       No abnormality visualized.

 Left Ovary:    Not visualized.
 Right Ovary:   Not visualized.
 Adnexa:     No abnormality visualized. No adnexal mass visualized.
Impression

 Single living intrauterine pregnancy at 37 weeks 2 days.
 Normal amniotic fluid volume.
 BPP [DATE].
Recommendations

 Follow-up ultrasounds as clinically indicated.

                     Grooms, Hash

## 2015-10-24 LAB — OB RESULTS CONSOLE ABO/RH: RH Type: POSITIVE

## 2015-10-24 LAB — OB RESULTS CONSOLE HIV ANTIBODY (ROUTINE TESTING): HIV: NONREACTIVE

## 2015-10-24 LAB — OB RESULTS CONSOLE GC/CHLAMYDIA
CHLAMYDIA, DNA PROBE: NEGATIVE
Gonorrhea: NEGATIVE

## 2015-10-24 LAB — OB RESULTS CONSOLE RPR: RPR: NONREACTIVE

## 2015-10-24 LAB — OB RESULTS CONSOLE ANTIBODY SCREEN: Antibody Screen: NEGATIVE

## 2015-10-24 LAB — OB RESULTS CONSOLE HEPATITIS B SURFACE ANTIGEN: HEP B S AG: NEGATIVE

## 2015-10-24 LAB — OB RESULTS CONSOLE RUBELLA ANTIBODY, IGM: Rubella: IMMUNE

## 2016-05-02 ENCOUNTER — Encounter (HOSPITAL_COMMUNITY): Payer: Self-pay

## 2016-05-02 LAB — OB RESULTS CONSOLE GBS: STREP GROUP B AG: POSITIVE

## 2016-05-02 NOTE — Patient Instructions (Signed)
20 Colbert EwingJennifer L Roderick  05/02/2016   Your procedure is scheduled on:  05/17/2016  [facility] through the Main Entrance of [facility] at 0600AM.  Pick up the phone at the desk and dial [phone].   Call this number if you have problems the morning of surgery: [phone]   Remember:   Do not eat food:After Midnight.  Do not drink clear liquids: After Midnight.  Take these medicines the morning of surgery with A SIP OF WATER: none   Do not wear jewelry, make-up or nail polish.  Do not wear lotions, powders, or perfumes. You may wear deodorant.  Do not shave 48 hours prior to surgery.  Do not bring valuables to the hospital.  [facility] is not   responsible for any belongings or valuables brought to the hospital.  Contacts, dentures or bridgework may not be worn into surgery.  Leave suitcase in the car. After surgery it may be brought to your room.  For patients admitted to the hospital, checkout time is 11:00 AM the day of              discharge.   Patients discharged the day of surgery will not be allowed to drive             home.  Name and phone number of your driver: na  Special Instructions:   Shower using CHG 2 nights before surgery and the night before surgery.  If you shower the day of surgery use CHG.  Use special wash - you have one bottle of CHG for all showers.  You should use approximately 1/3 of the bottle for each shower.   Please read over the following fact sheets that you were given:   Surgical Site Infection Prevention

## 2016-05-03 ENCOUNTER — Telehealth (HOSPITAL_COMMUNITY): Payer: Self-pay | Admitting: *Deleted

## 2016-05-03 NOTE — Telephone Encounter (Signed)
Preadmission screen  

## 2016-05-12 ENCOUNTER — Inpatient Hospital Stay (HOSPITAL_COMMUNITY): Payer: PRIVATE HEALTH INSURANCE | Admitting: Anesthesiology

## 2016-05-12 ENCOUNTER — Encounter (HOSPITAL_COMMUNITY): Payer: Self-pay | Admitting: *Deleted

## 2016-05-12 ENCOUNTER — Inpatient Hospital Stay (HOSPITAL_COMMUNITY)
Admission: AD | Admit: 2016-05-12 | Discharge: 2016-05-14 | DRG: 766 | Disposition: A | Discharge status: Home / Self Care | Payer: PRIVATE HEALTH INSURANCE | Source: Ambulatory Visit | Attending: Obstetrics and Gynecology | Admitting: Obstetrics and Gynecology

## 2016-05-12 ENCOUNTER — Encounter (HOSPITAL_COMMUNITY)
Admission: AD | Disposition: A | Discharge status: Home / Self Care | Payer: Self-pay | Source: Ambulatory Visit | Attending: Obstetrics and Gynecology

## 2016-05-12 DIAGNOSIS — O99824 Streptococcus B carrier state complicating childbirth: Secondary | ICD-10-CM | POA: Diagnosis present

## 2016-05-12 DIAGNOSIS — Z87891 Personal history of nicotine dependence: Secondary | ICD-10-CM

## 2016-05-12 DIAGNOSIS — Z98891 History of uterine scar from previous surgery: Secondary | ICD-10-CM

## 2016-05-12 DIAGNOSIS — O3663X0 Maternal care for excessive fetal growth, third trimester, not applicable or unspecified: Secondary | ICD-10-CM | POA: Diagnosis present

## 2016-05-12 DIAGNOSIS — O9952 Diseases of the respiratory system complicating childbirth: Secondary | ICD-10-CM | POA: Diagnosis present

## 2016-05-12 DIAGNOSIS — O34211 Maternal care for low transverse scar from previous cesarean delivery: Secondary | ICD-10-CM | POA: Diagnosis present

## 2016-05-12 DIAGNOSIS — J45909 Unspecified asthma, uncomplicated: Secondary | ICD-10-CM | POA: Diagnosis present

## 2016-05-12 DIAGNOSIS — Z3A38 38 weeks gestation of pregnancy: Secondary | ICD-10-CM

## 2016-05-12 LAB — COMPREHENSIVE METABOLIC PANEL
ALT: 14 U/L (ref 14–54)
ANION GAP: 7 (ref 5–15)
AST: 25 U/L (ref 15–41)
Albumin: 2.9 g/dL — ABNORMAL LOW (ref 3.5–5.0)
Alkaline Phosphatase: 135 U/L — ABNORMAL HIGH (ref 38–126)
BILIRUBIN TOTAL: 0.4 mg/dL (ref 0.3–1.2)
BUN: 11 mg/dL (ref 6–20)
CO2: 21 mmol/L — ABNORMAL LOW (ref 22–32)
Calcium: 8.8 mg/dL — ABNORMAL LOW (ref 8.9–10.3)
Chloride: 107 mmol/L (ref 101–111)
Creatinine, Ser: 0.56 mg/dL (ref 0.44–1.00)
Glucose, Bld: 101 mg/dL — ABNORMAL HIGH (ref 65–99)
POTASSIUM: 4.1 mmol/L (ref 3.5–5.1)
Sodium: 135 mmol/L (ref 135–145)
TOTAL PROTEIN: 6 g/dL — AB (ref 6.5–8.1)

## 2016-05-12 LAB — PROTEIN / CREATININE RATIO, URINE
Creatinine, Urine: 90 mg/dL
Protein Creatinine Ratio: 0.13 mg/mg{Cre} (ref 0.00–0.15)
TOTAL PROTEIN, URINE: 12 mg/dL

## 2016-05-12 LAB — CBC
HEMATOCRIT: 33.8 % — AB (ref 36.0–46.0)
Hemoglobin: 11.7 g/dL — ABNORMAL LOW (ref 12.0–15.0)
MCH: 32.2 pg (ref 26.0–34.0)
MCHC: 34.6 g/dL (ref 30.0–36.0)
MCV: 93.1 fL (ref 78.0–100.0)
Platelets: 182 10*3/uL (ref 150–400)
RBC: 3.63 MIL/uL — AB (ref 3.87–5.11)
RDW: 13.2 % (ref 11.5–15.5)
WBC: 7.5 10*3/uL (ref 4.0–10.5)

## 2016-05-12 LAB — URINE MICROSCOPIC-ADD ON

## 2016-05-12 LAB — URINALYSIS, ROUTINE W REFLEX MICROSCOPIC
Bilirubin Urine: NEGATIVE
Glucose, UA: NEGATIVE mg/dL
Hgb urine dipstick: NEGATIVE
KETONES UR: NEGATIVE mg/dL
NITRITE: NEGATIVE
PROTEIN: NEGATIVE mg/dL
Specific Gravity, Urine: 1.015 (ref 1.005–1.030)
pH: 6 (ref 5.0–8.0)

## 2016-05-12 LAB — TYPE AND SCREEN
ABO/RH(D): O POS
Antibody Screen: NEGATIVE

## 2016-05-12 LAB — URIC ACID: URIC ACID, SERUM: 5.5 mg/dL (ref 2.3–6.6)

## 2016-05-12 LAB — LACTATE DEHYDROGENASE: LDH: 165 U/L (ref 98–192)

## 2016-05-12 LAB — POCT FERN TEST

## 2016-05-12 SURGERY — Surgical Case
Anesthesia: Spinal

## 2016-05-12 MED ORDER — PRENATAL MULTIVITAMIN CH
1.0000 | ORAL_TABLET | Freq: Every day | ORAL | Status: DC
  Administered 2016-05-13: 1 via ORAL
  Filled 2016-05-12: qty 1

## 2016-05-12 MED ORDER — SOD CITRATE-CITRIC ACID 500-334 MG/5ML PO SOLN
30.0000 mL | Freq: Once | ORAL | Status: AC
  Administered 2016-05-12: 30 mL via ORAL

## 2016-05-12 MED ORDER — SIMETHICONE 80 MG PO CHEW: 80.0000 mg | CHEWABLE_TABLET | ORAL | Status: DC | PRN

## 2016-05-12 MED ORDER — IBUPROFEN 600 MG PO TABS
600.0000 mg | ORAL_TABLET | Freq: Four times a day (QID) | ORAL | Status: DC
  Administered 2016-05-13 – 2016-05-14 (×6): 600 mg via ORAL
  Filled 2016-05-12 (×6): qty 1

## 2016-05-12 MED ORDER — PHENYLEPHRINE 8 MG IN D5W 100 ML (0.08MG/ML) PREMIX OPTIME
INJECTION | INTRAVENOUS | Status: AC
  Filled 2016-05-12: qty 100

## 2016-05-12 MED ORDER — SCOPOLAMINE 1 MG/3DAYS TD PT72
MEDICATED_PATCH | TRANSDERMAL | Status: DC | PRN
  Administered 2016-05-12: 1 via TRANSDERMAL

## 2016-05-12 MED ORDER — NALBUPHINE HCL 10 MG/ML IJ SOLN
5.0000 mg | Freq: Once | INTRAMUSCULAR | Status: AC | PRN
  Administered 2016-05-12: 5 mg via INTRAVENOUS

## 2016-05-12 MED ORDER — MEPERIDINE HCL 25 MG/ML IJ SOLN: 6.2500 mg | INTRAMUSCULAR | Status: DC | PRN

## 2016-05-12 MED ORDER — KETOROLAC TROMETHAMINE 30 MG/ML IJ SOLN
30.0000 mg | Freq: Four times a day (QID) | INTRAMUSCULAR | Status: AC | PRN
  Administered 2016-05-12: 30 mg via INTRAMUSCULAR

## 2016-05-12 MED ORDER — OXYTOCIN 10 UNIT/ML IJ SOLN
40.0000 [IU] | INTRAVENOUS | Status: DC | PRN
  Administered 2016-05-12: 40 [IU] via INTRAVENOUS

## 2016-05-12 MED ORDER — SENNOSIDES-DOCUSATE SODIUM 8.6-50 MG PO TABS
2.0000 | ORAL_TABLET | ORAL | Status: DC
  Administered 2016-05-13: 2 via ORAL
  Filled 2016-05-12: qty 2

## 2016-05-12 MED ORDER — ACETAMINOPHEN 325 MG PO TABS
650.0000 mg | ORAL_TABLET | ORAL | Status: DC | PRN
  Administered 2016-05-13: 650 mg via ORAL
  Filled 2016-05-12: qty 2

## 2016-05-12 MED ORDER — MENTHOL 3 MG MT LOZG: 1.0000 | LOZENGE | OROMUCOSAL | Status: DC | PRN

## 2016-05-12 MED ORDER — NALBUPHINE HCL 10 MG/ML IJ SOLN: 5.0000 mg | INTRAMUSCULAR | Status: DC | PRN

## 2016-05-12 MED ORDER — BUPIVACAINE IN DEXTROSE 0.75-8.25 % IT SOLN
INTRATHECAL | Status: DC | PRN
Start: 2016-05-12 — End: 2016-05-12
  Administered 2016-05-12: 1.5 mL via INTRATHECAL

## 2016-05-12 MED ORDER — DIBUCAINE 1 % RE OINT: 1.0000 "application " | TOPICAL_OINTMENT | RECTAL | Status: DC | PRN

## 2016-05-12 MED ORDER — MORPHINE SULFATE (PF) 0.5 MG/ML IJ SOLN
INTRAMUSCULAR | Status: DC | PRN
  Administered 2016-05-12: .1 mg via INTRATHECAL

## 2016-05-12 MED ORDER — FAMOTIDINE IN NACL 20-0.9 MG/50ML-% IV SOLN
20.0000 mg | Freq: Once | INTRAVENOUS | Status: AC
  Administered 2016-05-12: 20 mg via INTRAVENOUS

## 2016-05-12 MED ORDER — FENTANYL CITRATE (PF) 100 MCG/2ML IJ SOLN
INTRAMUSCULAR | Status: DC | PRN
  Administered 2016-05-12: 12.5 ug via INTRATHECAL

## 2016-05-12 MED ORDER — FENTANYL CITRATE (PF) 100 MCG/2ML IJ SOLN: 25.0000 ug | INTRAMUSCULAR | Status: DC | PRN

## 2016-05-12 MED ORDER — NALOXONE HCL 0.4 MG/ML IJ SOLN: 0.4000 mg | INTRAMUSCULAR | Status: DC | PRN

## 2016-05-12 MED ORDER — HYDROMORPHONE HCL 2 MG PO TABS: 2.0000 mg | ORAL_TABLET | ORAL | Status: DC | PRN

## 2016-05-12 MED ORDER — SOD CITRATE-CITRIC ACID 500-334 MG/5ML PO SOLN
ORAL | Status: AC
  Filled 2016-05-12: qty 15

## 2016-05-12 MED ORDER — LACTATED RINGERS IV SOLN
INTRAVENOUS | Status: DC
  Administered 2016-05-13: 999 mL via INTRAVENOUS

## 2016-05-12 MED ORDER — DIPHENHYDRAMINE HCL 25 MG PO CAPS: 25.0000 mg | ORAL_CAPSULE | Freq: Four times a day (QID) | ORAL | Status: DC | PRN

## 2016-05-12 MED ORDER — FENTANYL CITRATE (PF) 100 MCG/2ML IJ SOLN
INTRAMUSCULAR | Status: AC
  Filled 2016-05-12: qty 2

## 2016-05-12 MED ORDER — ONDANSETRON HCL 4 MG/2ML IJ SOLN
INTRAMUSCULAR | Status: AC
  Filled 2016-05-12: qty 2

## 2016-05-12 MED ORDER — LACTATED RINGERS IV SOLN
INTRAVENOUS | Status: DC | PRN
  Administered 2016-05-12 (×2): via INTRAVENOUS

## 2016-05-12 MED ORDER — TETANUS-DIPHTH-ACELL PERTUSSIS 5-2.5-18.5 LF-MCG/0.5 IM SUSP: 0.5000 mL | Freq: Once | INTRAMUSCULAR | Status: DC

## 2016-05-12 MED ORDER — COCONUT OIL OIL: 1.0000 "application " | TOPICAL_OIL | Status: DC | PRN

## 2016-05-12 MED ORDER — GENTAMICIN SULFATE 40 MG/ML IJ SOLN
5.0000 mg/kg | INTRAMUSCULAR | Status: AC
  Administered 2016-05-12: 470 mg via INTRAVENOUS
  Filled 2016-05-12: qty 11.75

## 2016-05-12 MED ORDER — DIPHENHYDRAMINE HCL 25 MG PO CAPS
25.0000 mg | ORAL_CAPSULE | ORAL | Status: DC | PRN
  Filled 2016-05-12: qty 1

## 2016-05-12 MED ORDER — DIPHENHYDRAMINE HCL 50 MG/ML IJ SOLN
12.5000 mg | INTRAMUSCULAR | Status: DC | PRN
  Administered 2016-05-12: 12.5 mg via INTRAVENOUS

## 2016-05-12 MED ORDER — NALBUPHINE HCL 10 MG/ML IJ SOLN
5.0000 mg | INTRAMUSCULAR | Status: DC | PRN
  Administered 2016-05-13 (×2): 5 mg via SUBCUTANEOUS
  Filled 2016-05-12 (×2): qty 1

## 2016-05-12 MED ORDER — MORPHINE SULFATE (PF) 0.5 MG/ML IJ SOLN
INTRAMUSCULAR | Status: AC
  Filled 2016-05-12: qty 10

## 2016-05-12 MED ORDER — OXYTOCIN 40 UNITS IN LACTATED RINGERS INFUSION - SIMPLE MED: 2.5000 [IU]/h | INTRAVENOUS | Status: AC

## 2016-05-12 MED ORDER — NALBUPHINE HCL 10 MG/ML IJ SOLN: 5.0000 mg | Freq: Once | INTRAMUSCULAR | Status: AC | PRN

## 2016-05-12 MED ORDER — SIMETHICONE 80 MG PO CHEW
80.0000 mg | CHEWABLE_TABLET | ORAL | Status: DC
  Administered 2016-05-13: 80 mg via ORAL
  Filled 2016-05-12: qty 1

## 2016-05-12 MED ORDER — CLINDAMYCIN PHOSPHATE 900 MG/50ML IV SOLN
900.0000 mg | INTRAVENOUS | Status: AC
  Administered 2016-05-12: 900 mg via INTRAVENOUS
  Filled 2016-05-12: qty 50

## 2016-05-12 MED ORDER — ONDANSETRON HCL 4 MG/2ML IJ SOLN
INTRAMUSCULAR | Status: DC | PRN
Start: 2016-05-12 — End: 2016-05-12
  Administered 2016-05-12: 4 mg via INTRAVENOUS

## 2016-05-12 MED ORDER — PHENYLEPHRINE 8 MG IN D5W 100 ML (0.08MG/ML) PREMIX OPTIME
INJECTION | INTRAVENOUS | Status: DC | PRN
  Administered 2016-05-12: 60 ug/min via INTRAVENOUS

## 2016-05-12 MED ORDER — LACTATED RINGERS IV SOLN: INTRAVENOUS | Status: DC

## 2016-05-12 MED ORDER — KETOROLAC TROMETHAMINE 30 MG/ML IJ SOLN
INTRAMUSCULAR | Status: AC
  Administered 2016-05-12: 30 mg via INTRAMUSCULAR
  Filled 2016-05-12: qty 1

## 2016-05-12 MED ORDER — SIMETHICONE 80 MG PO CHEW
80.0000 mg | CHEWABLE_TABLET | Freq: Three times a day (TID) | ORAL | Status: DC
  Administered 2016-05-13 – 2016-05-14 (×4): 80 mg via ORAL
  Filled 2016-05-12 (×4): qty 1

## 2016-05-12 MED ORDER — SCOPOLAMINE 1 MG/3DAYS TD PT72
MEDICATED_PATCH | TRANSDERMAL | Status: AC
  Filled 2016-05-12: qty 1

## 2016-05-12 MED ORDER — ZOLPIDEM TARTRATE 5 MG PO TABS: 5.0000 mg | ORAL_TABLET | Freq: Every evening | ORAL | Status: DC | PRN

## 2016-05-12 MED ORDER — WITCH HAZEL-GLYCERIN EX PADS: 1.0000 "application " | MEDICATED_PAD | CUTANEOUS | Status: DC | PRN

## 2016-05-12 MED ORDER — DEXTROSE 5 % IV SOLN
1.0000 ug/kg/h | INTRAVENOUS | Status: DC | PRN
  Filled 2016-05-12: qty 2

## 2016-05-12 MED ORDER — KETOROLAC TROMETHAMINE 30 MG/ML IJ SOLN: 30.0000 mg | Freq: Four times a day (QID) | INTRAMUSCULAR | Status: AC | PRN

## 2016-05-12 MED ORDER — ONDANSETRON HCL 4 MG/2ML IJ SOLN: 4.0000 mg | Freq: Three times a day (TID) | INTRAMUSCULAR | Status: DC | PRN

## 2016-05-12 MED ORDER — DIPHENHYDRAMINE HCL 50 MG/ML IJ SOLN
INTRAMUSCULAR | Status: AC
  Filled 2016-05-12: qty 1

## 2016-05-12 MED ORDER — NALBUPHINE HCL 10 MG/ML IJ SOLN
INTRAMUSCULAR | Status: AC
  Administered 2016-05-12: 5 mg via INTRAVENOUS
  Filled 2016-05-12: qty 1

## 2016-05-12 MED ORDER — SCOPOLAMINE 1 MG/3DAYS TD PT72: 1.0000 | MEDICATED_PATCH | Freq: Once | TRANSDERMAL | Status: DC

## 2016-05-12 MED ORDER — OXYTOCIN 10 UNIT/ML IJ SOLN
INTRAMUSCULAR | Status: AC
  Filled 2016-05-12: qty 4

## 2016-05-12 MED ORDER — LACTATED RINGERS IV SOLN
INTRAVENOUS | Status: DC | PRN
  Administered 2016-05-12: 19:00:00 via INTRAVENOUS

## 2016-05-12 MED ORDER — FAMOTIDINE IN NACL 20-0.9 MG/50ML-% IV SOLN
INTRAVENOUS | Status: AC
  Filled 2016-05-12: qty 50

## 2016-05-12 MED ORDER — SODIUM CHLORIDE 0.9% FLUSH: 3.0000 mL | INTRAVENOUS | Status: DC | PRN

## 2016-05-12 SURGICAL SUPPLY — 36 items
APL SKNCLS STERI-STRIP NONHPOA (GAUZE/BANDAGES/DRESSINGS) ×1
BARRIER ADHS 3X4 INTERCEED (GAUZE/BANDAGES/DRESSINGS) IMPLANT
BENZOIN TINCTURE PRP APPL 2/3 (GAUZE/BANDAGES/DRESSINGS) ×1 IMPLANT
BRR ADH 4X3 ABS CNTRL BYND (GAUZE/BANDAGES/DRESSINGS)
CLAMP CORD UMBIL (MISCELLANEOUS) IMPLANT
CLOSURE STERI STRIP 1/2 X4 (GAUZE/BANDAGES/DRESSINGS) ×1 IMPLANT
CLOTH BEACON ORANGE TIMEOUT ST (SAFETY) ×2 IMPLANT
CONTAINER PREFILL 10% NBF 15ML (MISCELLANEOUS) IMPLANT
DRSG OPSITE POSTOP 4X10 (GAUZE/BANDAGES/DRESSINGS) ×2 IMPLANT
DURAPREP 26ML APPLICATOR (WOUND CARE) ×2 IMPLANT
ELECT REM PT RETURN 9FT ADLT (ELECTROSURGICAL) ×2
ELECTRODE REM PT RTRN 9FT ADLT (ELECTROSURGICAL) ×1 IMPLANT
EXTRACTOR VACUUM M CUP 4 TUBE (SUCTIONS) ×1 IMPLANT
GLOVE BIO SURGEON STRL SZ 6.5 (GLOVE) ×2 IMPLANT
GLOVE BIOGEL PI IND STRL 7.0 (GLOVE) ×1 IMPLANT
GLOVE BIOGEL PI INDICATOR 7.0 (GLOVE) ×1
GOWN STRL REUS W/TWL LRG LVL3 (GOWN DISPOSABLE) ×4 IMPLANT
KIT ABG SYR 3ML LUER SLIP (SYRINGE) IMPLANT
NDL HYPO 25X5/8 SAFETYGLIDE (NEEDLE) ×1 IMPLANT
NEEDLE HYPO 22GX1.5 SAFETY (NEEDLE) IMPLANT
NEEDLE HYPO 25X5/8 SAFETYGLIDE (NEEDLE) ×2 IMPLANT
NS IRRIG 1000ML POUR BTL (IV SOLUTION) ×2 IMPLANT
PACK C SECTION WH (CUSTOM PROCEDURE TRAY) ×2 IMPLANT
PAD ABD 7.5X8 STRL (GAUZE/BANDAGES/DRESSINGS) ×1 IMPLANT
PAD OB MATERNITY 4.3X12.25 (PERSONAL CARE ITEMS) ×2 IMPLANT
PENCIL SMOKE EVAC W/HOLSTER (ELECTROSURGICAL) ×2 IMPLANT
SPONGE GAUZE 4X4 12PLY STER LF (GAUZE/BANDAGES/DRESSINGS) ×2 IMPLANT
SUT CHROMIC 0 CTX 36 (SUTURE) ×4 IMPLANT
SUT PLAIN 0 NONE (SUTURE) IMPLANT
SUT PLAIN 2 0 XLH (SUTURE) IMPLANT
SUT VIC AB 0 CT1 27 (SUTURE) ×6
SUT VIC AB 0 CT1 27XBRD ANBCTR (SUTURE) ×3 IMPLANT
SUT VIC AB 4-0 KS 27 (SUTURE) ×1 IMPLANT
SYR CONTROL 10ML LL (SYRINGE) IMPLANT
TOWEL OR 17X24 6PK STRL BLUE (TOWEL DISPOSABLE) ×2 IMPLANT
TRAY FOLEY CATH SILVER 14FR (SET/KITS/TRAYS/PACK) ×2 IMPLANT

## 2016-05-12 NOTE — Brief Op Note (Signed)
05/12/2016  7:10 PM  PATIENT:  Colbert EwingJennifer L Jerkins  30 y.o. female  PRE-OPERATIVE DIAGNOSIS:   IUP at 838 w 3 days Previous C Section Early Labor  POST-OPERATIVE DIAGNOSIS:   Same  PROCEDURE:  Procedure(s): CESAREAN SECTION (N/A)  SURGEON:  Surgeon(s) and Role:    * Marcelle OverlieMichelle Comer Devins, MD - Primary  PHYSICIAN ASSISTANT:   ASSISTANTS: none   ANESTHESIA:   spinal  EBL:  Total I/O In: -  Out: 850 [Urine:150; Blood:700]  BLOOD ADMINISTERED:none  DRAINS: Urinary Catheter (Foley)   LOCAL MEDICATIONS USED:  NONE  SPECIMEN:  No Specimen  DISPOSITION OF SPECIMEN:  N/A  COUNTS:  YES  TOURNIQUET:  * No tourniquets in log *  DICTATION: .Other Dictation: Dictation Number [id number]  PLAN OF CARE: Other Dictation: Dictation Number [id number]  PATIENT DISPOSITION:  PACU - hemodynamically stable.   Delay start of Pharmacological VTE agent (>24hrs) due to surgical blood loss or risk of bleeding: not applicable

## 2016-05-12 NOTE — Progress Notes (Signed)
[facility]  Delivery Note:  C-section       05/12/2016  6:49 PM  I was called to the operating room at the request of the patient's obstetrician (Dr. Grewal) for a repeat c-section.  PRENATAL HX:  This is a 29 y/o G2P1001 at 38 and 3/[redacted] weeks gestation who was admitted today in early labor.  Pregnancy is uncomplicated and delivery is by repeat c-section.    INTRAPARTUM HX:   Repeat c-section with AROM at delivery.  Vacuum extraction.    DELIVERY:  Infant was vigorous at delivery, requiring no resuscitation other than standard warming, drying and stimulation.  APGARs 8 and 9.  LGA female, exam within normal limits.  After 5 minutes, baby left with nurse to assist parents with skin-to-skin care.   _____________________ Electronically Signed By: Ilhan Madan, MD Neonatologist  

## 2016-05-12 NOTE — MAU Provider Note (Signed)
Chief Complaint:  Labor Eval   First Provider Initiated Contact with Patient 05/12/16 1600      HPI: Kim [name] is a 30 y.o. G2P1001 at 64w3dwho presents to maternity admissions reporting gush of fluid making large spot in her underwear today.  She reports no additional leaking since this episode. She also reports regular mild cramping today, onset before the leaking fluid.  Over time in the MAU, her contractions are becoming stronger. She plans a repeat C/S this pregnancy because the baby's size is estimated to be large.  She has not tried any treatments, nothing makes her symptoms better or worse, and the leaking is not associated with other symptoms. She reports good fetal movement, denies vaginal bleeding, vaginal itching/burning, urinary symptoms, h/a, dizziness, n/v, or fever/chills.    HPI  Past Medical History: Past Medical History  Diagnosis Date  . Medical history non-contributory   . Hx of varicella   . Asthma     exercise induced    Past obstetric history: OB History  Gravida Para Term Preterm AB SAB TAB Ectopic Multiple Living  2 1 1       1     # Outcome Date GA Lbr Len/2nd Weight Sex Delivery Anes PTL Lv  2 Current           1 Term 07/15/14 [redacted]w[redacted]d  7 lb 6.5 oz (3.359 kg) F CS-LTranv Spinal  Y      Past Surgical History: Past Surgical History  Procedure Laterality Date  . Vulva /perineum biopsy  12/2003    Condyloma  . Breast enhancement surgery  11/10  . No past surgeries    . Cesarean section N/A 07/15/2014    Procedure: PRIMARY CESAREAN SECTION;  Surgeon: Jeani Hawking, MD;  Location: [facility];  Service: Obstetrics;  Laterality: N/A;  EDC 08/02/14    Family History: Family History  Problem Relation Age of Onset  . Cancer Paternal Aunt     breast  . Cancer Maternal Grandmother     colon    Social History: Social History  Substance Use Topics  . Smoking status: Former Games developer  . Smokeless tobacco: None  . Alcohol Use: No    Allergies:   Allergies  Allergen Reactions  . Biaxin [Clarithromycin] Hives  . Cefaclor Hives  . Penicillins Hives    Has patient had a PCN reaction causing immediate rash, facial/tongue/throat swelling, SOB or lightheadedness with hypotension: Yes Has patient had a PCN reaction causing severe rash involving mucus membranes or skin necrosis: Yes Has patient had a PCN reaction that required hospitalization Yes Has patient had a PCN reaction occurring within the last 10 years: No If all of the above answers are "NO", then may proceed with Cephalosporin use.  . Sulfa Antibiotics Hives  . Codeine Itching    hyper    Meds:  Prescriptions prior to admission  Medication Sig Dispense Refill Last Dose  . acetaminophen (TYLENOL) 325 MG tablet Take 325 mg by mouth every 6 (six) hours as needed for headache.   Past Week at Unknown time  . calcium carbonate (TUMS - DOSED IN MG ELEMENTAL CALCIUM) 500 MG chewable tablet Chew 1 tablet by mouth daily.   Past Week at Unknown time  . Famotidine (PEPCID AC PO) Take 1 tablet by mouth at bedtime.   05/11/2016 at Unknown time  . fluticasone (FLONASE) 50 MCG/ACT nasal spray Place 1 spray into both nostrils daily.   Past Week at Unknown time  . Multiple Vitamins-Minerals (  MULTIVITAMIN GUMMIES ADULT PO) Take 1 tablet by mouth daily.    Past Week at Unknown time  . Prenatal Vit-Fe Fumarate-FA (MULTIVITAMIN-PRENATAL) 27-0.8 MG TABS tablet Take 1 tablet by mouth daily at 12 noon.   05/11/2016 at Unknown time    ROS:  Review of Systems  Constitutional: Negative for fever, chills and fatigue.  Eyes: Negative for visual disturbance.  Respiratory: Negative for shortness of breath.   Cardiovascular: Negative for chest pain.  Gastrointestinal: Positive for abdominal pain. Negative for nausea and vomiting.  Genitourinary: Negative for dysuria, flank pain, vaginal bleeding, vaginal discharge, difficulty urinating, vaginal pain and pelvic pain.  Musculoskeletal: Positive for back  pain.  Neurological: Negative for dizziness and headaches.  Psychiatric/Behavioral: Negative.      I have reviewed patient's Past Medical Hx, Surgical Hx, Family Hx, Social Hx, medications and allergies.   Physical Exam   Patient Vitals for the past 24 hrs:  BP Temp Temp src Pulse Resp SpO2 Height Weight  05/12/16 1729 123/79 mmHg - - 89 - - - -  05/12/16 1714 120/83 mmHg - - 87 - - - -  05/12/16 1659 123/79 mmHg - - 87 - - - -  05/12/16 1644 125/82 mmHg - - 90 - - - -  05/12/16 1629 117/77 mmHg - - 85 - - - -  05/12/16 1614 119/83 mmHg - - 91 - - - -  05/12/16 1559 134/88 mmHg - - 96 - - - -  05/12/16 1533 125/87 mmHg - - - - - - -  05/12/16 1526 144/90 mmHg - - 90 - - - -  05/12/16 1514 140/90 mmHg 97.5 F (36.4 C) Oral 83 18 100 % 5' 5.5" (1.664 m) 206 lb 4 oz (93.554 kg)   Constitutional: Well-developed, well-nourished female in no acute distress.  Cardiovascular: normal rate Respiratory: normal effort GI: Abd soft, non-tender, gravid appropriate for gestational age.  MS: Extremities nontender, no edema, normal ROM Neurologic: Alert and oriented x 4.  GU: Neg CVAT.  PELVIC EXAM: Cervix pink, visually closed, without lesion, scant white creamy discharge, no pooling of fluid with Valsalva, vaginal walls and external genitalia normal  Initial exam cervix 2/80/-2, posterior  Recheck in 2 hours: Dilation: 3 Effacement (%): 80 Cervical Position: Posterior Exam by:: Sharen CounterLisa Leftwich-Kirby CNM  FHT:  Baseline 135 , moderate variability, accelerations present, no decelerations Contractions: q 5-6 mins, regular, mild to palpation   Labs: Results for orders placed or performed during the hospital encounter of 05/12/16 (from the past 24 hour(s))  Urinalysis, Routine w reflex microscopic (not at [facility])     Status: Abnormal   Collection Time: 05/12/16  3:08 PM  Result Value Ref Range   Color, Urine YELLOW YELLOW   APPearance HAZY (A) CLEAR   Specific Gravity, Urine 1.015 1.005 -  1.030   pH 6.0 5.0 - 8.0   Glucose, UA NEGATIVE NEGATIVE mg/dL   Hgb urine dipstick NEGATIVE NEGATIVE   Bilirubin Urine NEGATIVE NEGATIVE   Ketones, ur NEGATIVE NEGATIVE mg/dL   Protein, ur NEGATIVE NEGATIVE mg/dL   Nitrite NEGATIVE NEGATIVE   Leukocytes, UA SMALL (A) NEGATIVE  Protein / creatinine ratio, urine     Status: None   Collection Time: 05/12/16  3:08 PM  Result Value Ref Range   Creatinine, Urine 90.00 mg/dL   Total Protein, Urine 12 mg/dL   Protein Creatinine Ratio 0.13 0.00 - 0.15 mg/mg[Cre]  Urine microscopic-add on     Status: Abnormal   Collection Time: 05/12/16  3:08 PM  Result Value Ref Range   Squamous Epithelial / LPF 0-5 (A) NONE SEEN   WBC, UA 6-30 0 - 5 WBC/hpf   RBC / HPF 0-5 0 - 5 RBC/hpf   Bacteria, UA MANY (A) NONE SEEN   Urine-Other MUCOUS PRESENT   CBC     Status: Abnormal   Collection Time: 05/12/16  3:45 PM  Result Value Ref Range   WBC 7.5 4.0 - 10.5 K/uL   RBC 3.63 (L) 3.87 - 5.11 MIL/uL   Hemoglobin 11.7 (L) 12.0 - 15.0 g/dL   HCT 64.333.8 (L) 32.936.0 - 51.846.0 %   MCV 93.1 78.0 - 100.0 fL   MCH 32.2 26.0 - 34.0 pg   MCHC 34.6 30.0 - 36.0 g/dL   RDW 84.113.2 66.011.5 - 63.015.5 %   Platelets 182 150 - 400 K/uL  Comprehensive metabolic panel     Status: Abnormal   Collection Time: 05/12/16  3:45 PM  Result Value Ref Range   Sodium 135 135 - 145 mmol/L   Potassium 4.1 3.5 - 5.1 mmol/L   Chloride 107 101 - 111 mmol/L   CO2 21 (L) 22 - 32 mmol/L   Glucose, Bld 101 (H) 65 - 99 mg/dL   BUN 11 6 - 20 mg/dL   Creatinine, Ser 1.600.56 0.44 - 1.00 mg/dL   Calcium 8.8 (L) 8.9 - 10.3 mg/dL   Total Protein 6.0 (L) 6.5 - 8.1 g/dL   Albumin 2.9 (L) 3.5 - 5.0 g/dL   AST 25 15 - 41 U/L   ALT 14 14 - 54 U/L   Alkaline Phosphatase 135 (H) 38 - 126 U/L   Total Bilirubin 0.4 0.3 - 1.2 mg/dL   GFR calc non Af Amer >60 >60 mL/min   GFR calc Af Amer >60 >60 mL/min   Anion gap 7 5 - 15  Uric acid     Status: None   Collection Time: 05/12/16  3:45 PM  Result Value Ref Range    Uric Acid, Serum 5.5 2.3 - 6.6 mg/dL  Lactate dehydrogenase     Status: None   Collection Time: 05/12/16  3:45 PM  Result Value Ref Range   LDH 165 98 - 192 U/L  Fern Test     Status: Normal   Collection Time: 05/12/16  4:18 PM  Result Value Ref Range   POCT Fern Test     Ferning negative on slide  O/Positive/-- (12/06 0000)  Imaging:  No results found.  MAU Course/MDM: Upon arrival, FHR noted to be elevated so IV was started, with continued monitoring it appears to be a long acceleration with baseline of 135 with moderate variability and no decelerations.  LR x 1000 ml given.  Preeclampsia labs ordered r/t elevated BP x 2 and results wnl.  Pt contractions became stronger in MAU and upon recheck, cervix changing from 2 cm to 3 cm dilated.  Dr Vincente PoliGrewal notified.  Plan to do repeat C/S today. Pt stable at time of transfer.  Assessment: G2P1001 @[redacted]w[redacted]d  in early labor with hx C/S and planning RLTCS GBS positive  Plan: Admit pt with plan to go to OR for delivery     Medication List    ASK your doctor about these medications        acetaminophen 325 MG tablet  Commonly known as:  TYLENOL  Take 325 mg by mouth every 6 (six) hours as needed for headache.     calcium carbonate 500 MG chewable tablet  Commonly  known as:  TUMS - dosed in mg elemental calcium  Chew 1 tablet by mouth daily.     fluticasone 50 MCG/ACT nasal spray  Commonly known as:  FLONASE  Place 1 spray into both nostrils daily.     MULTIVITAMIN GUMMIES ADULT PO  Take 1 tablet by mouth daily.     multivitamin-prenatal 27-0.8 MG Tabs tablet  Take 1 tablet by mouth daily at 12 noon.     PEPCID AC PO  Take 1 tablet by mouth at bedtime.        Sharen Counter Certified Nurse-Midwife 05/12/2016 5:47 PM

## 2016-05-12 NOTE — Anesthesia Preprocedure Evaluation (Signed)
Anesthesia Evaluation  Patient identified by MRN, date of birth, ID band Patient awake    Reviewed: Allergy & Precautions, NPO status , Patient's Chart, lab work & pertinent test results  Airway Mallampati: II  TM Distance: >3 FB Neck ROM: Full    Dental no notable dental hx.    Pulmonary former smoker,    Pulmonary exam normal breath sounds clear to auscultation       Cardiovascular negative cardio ROS Normal cardiovascular exam Rhythm:Regular Rate:Normal     Neuro/Psych negative neurological ROS  negative psych ROS   GI/Hepatic negative GI ROS, Neg liver ROS,   Endo/Other  negative endocrine ROS  Renal/GU negative Renal ROS  negative genitourinary   Musculoskeletal negative musculoskeletal ROS (+)   Abdominal   Peds negative pediatric ROS (+)  Hematology negative hematology ROS (+)   Anesthesia Other Findings   Reproductive/Obstetrics (+) Pregnancy                             Anesthesia Physical Anesthesia Plan  ASA: II  Anesthesia Plan: Spinal   Post-op Pain Management:    Induction:   Airway Management Planned: Natural Airway  Additional Equipment:   Intra-op Plan:   Post-operative Plan:   Informed Consent: I have reviewed the patients History and Physical, chart, labs and discussed the procedure including the risks, benefits and alternatives for the proposed anesthesia with the patient or authorized representative who has indicated his/her understanding and acceptance.   Dental advisory given  Plan Discussed with: CRNA  Anesthesia Plan Comments:         Anesthesia Quick Evaluation  

## 2016-05-12 NOTE — Op Note (Signed)
NAMBrooks [name]:  Oleksy, Drusilla             ACCOUNT NO.:  [id number]  MEDICAL RECORD NO.:  [id number]  LOCATION:  [facility]                          FACILITY:  [facility]  PHYSICIAN:  Damier Disano L. Hallelujah Wysong, M.D.DATE OF BIRTH:  1986-05-14  DATE OF PROCEDURE:  05/12/2016 DATE OF DISCHARGE:                              OPERATIVE REPORT   PREOPERATIVE DIAGNOSES:  IUP at 38 weeks and 3 days, previous C-section in early labor.  POSTOPERATIVE DIAGNOSES:  IUP at 38 weeks and 3 days, previous C-section in early labor.  PROCEDURE:  Repeat low transverse cesarean section.  SURGEONS:  Tania Steinhauser L. Vincente PoliGrewal, M.D.  ANESTHESIA:  Spinal.  ESTIMATED BLOOD LOSS:  700 mL.  PATHOLOGY:  None.  DRAINS:  Foley catheter.  COMPLICATIONS:  None.  PROCEDURE DESCRIPTION:  The patient was taken to the operating room. Spinal was placed.  She was prepped and draped and a Foley catheter was inserted.  Time-out was performed.  A low transverse incision was made, carried down to the fascia.  Fascia was scored in the midline and extended laterally.  Rectus muscles were separated in the midline and the peritoneum was entered.  The peritoneal incision was then stretched. The bladder blade was inserted.  The lower uterine segment was identified and a bladder flap was created.  A low transverse incision was made in the uterus.  Uterus was entered using a hemostat.  The baby was in cephalic presentation, was delivered easily with 2 pulls of the vacuum.  The baby appeared to be large for gestational age.  Was a female infant, Apgars 8 at 1 minute, 9 at 5 minutes.  The baby was handed to the neonatal team after the cord was clamped and cut.  The placenta was manually removed, noted to be normal intact with a three- vessel cord.  The uterus was exteriorized and cleared of all clots and debris.  The uterus contracted down nicely, and Pitocin was given.  The uterine incision was closed in 1 layer using 0 chromic in a running- locked  stitch.  Hemostasis was excellent.  The uterus was returned to the abdomen.  Irrigation was performed.  Hemostasis was noted.  The peritoneum and rectus muscles were closed. The fascia was closed in a running stitch starting each corner and meeting in the midline.  After irrigation of subcutaneous layer, the skin was closed with a 4-0 Vicryl on a Keith needle.  All sponge, lap, instrument counts were correct x2.  The patient went to recovery room in stable condition.     Saki Legore L. Vincente PoliGrewal, M.D.     Florestine AversMLG/MEDQ  D:  05/12/2016  T:  05/12/2016  Job:  [id number]

## 2016-05-12 NOTE — Anesthesia Procedure Notes (Signed)
Spinal Patient location during procedure: OR Staffing Anesthesiologist: Cal Gindlesperger Performed by: anesthesiologist  Preanesthetic Checklist Completed: patient identified, site marked, surgical consent, pre-op evaluation, timeout performed, IV checked, risks and benefits discussed and monitors and equipment checked Spinal Block Patient position: sitting Prep: ChloraPrep Patient monitoring: heart rate, continuous pulse ox and blood pressure Approach: midline Location: L4-5 Injection technique: single-shot Needle Needle type: Sprotte  Needle gauge: 24 G Needle length: 9 cm Additional Notes Expiration date of kit checked and confirmed. Patient tolerated procedure well, without complications.     

## 2016-05-12 NOTE — Transfer of Care (Signed)
Immediate Anesthesia Transfer of Care Note  Patient: Kim EwingJennifer L [name]  Procedure(s) Performed: Procedure(s): CESAREAN SECTION (N/A)  Patient Location: PACU  Anesthesia Type:Spinal  Level of Consciousness: awake  Airway & Oxygen Therapy: Patient Spontanous Breathing  Post-op Assessment: Report given to RN and Post -op Vital signs reviewed and stable  Post vital signs: stable  Last Vitals:  Filed Vitals:   05/12/16 1714 05/12/16 1729  BP: 120/83 123/79  Pulse: 87 89  Temp:    Resp:      Last Pain:  Filed Vitals:   05/12/16 1858  PainSc: 0-No pain         Complications: No apparent anesthesia complications

## 2016-05-12 NOTE — MAU Note (Signed)
Pt. States that she went to the bathroom at around 12 today and she noticed a small wet spot in her underwear.  She has been wearing a pad but hasn't noticed any leaking since then.  Pt. Denies any bleeding.

## 2016-05-12 NOTE — H&P (Signed)
Kim EwingJennifer [name] [name] is a 30 y.o. G 2 P 1001 at 5938 w 3 days presents in early labor and has had 1 previous C Section Maternal Medical History:  Reason for admission: Contractions.     OB History    Gravida Para Term Preterm AB TAB SAB Ectopic Multiple Living   2 1 1       1      Past Medical History  Diagnosis Date  . Medical history non-contributory   . Hx of varicella   . Asthma     exercise induced   Past Surgical History  Procedure Laterality Date  . Vulva /perineum biopsy  12/2003    Condyloma  . Breast enhancement surgery  11/10  . No past surgeries    . Cesarean section N/A 07/15/2014    Procedure: PRIMARY CESAREAN SECTION;  Surgeon: Jeani HawkingMichelle [name] Kalle Bernath, MD;  Location: [facility];  Service: Obstetrics;  Laterality: N/A;  EDC 08/02/14   Family History: family history includes Cancer in her maternal grandmother and paternal aunt. Social History:  reports that she has quit smoking. She does not have any smokeless tobacco history on file. She reports that she does not drink alcohol or use illicit drugs.   Prenatal Transfer Tool  Maternal Diabetes: No Genetic Screening: Normal Maternal Ultrasounds/Referrals: Normal Fetal Ultrasounds or other Referrals:  None Maternal Substance Abuse:  No Significant Maternal Medications:  None Significant Maternal Lab Results:  None Other Comments:  None  Review of Systems  All other systems reviewed and are negative.   Dilation: 3 Effacement (%): 80 Exam by:: Misty StanleyLisa Leftwich-Kirby CNM Blood pressure 123/79, pulse 89, temperature 97.5 F (36.4 C), temperature source Oral, resp. rate 18, height 5' 5.5" (1.664 m), weight 93.554 kg (206 lb 4 oz), SpO2 100 %, unknown if currently breastfeeding. Maternal Exam:  Uterine Assessment: Contraction strength is moderate.  Contraction frequency is regular.      Fetal Exam Fetal State Assessment: Category I - tracings are normal.     Physical Exam  Nursing note and vitals  reviewed. Constitutional: She appears well-developed and well-nourished.  HENT:  Head: Normocephalic.  Eyes: Pupils are equal, round, and reactive to light.  Neck: Normal range of motion.  Cardiovascular: Normal rate and regular rhythm.   Respiratory: Effort normal.    Prenatal labs: ABO, Rh: O/Positive/-- (12/06 0000) Antibody: Negative (12/06 0000) Rubella: Immune (12/06 0000) RPR: Nonreactive (12/06 0000)  HBsAg: Negative (12/06 0000)  HIV: Non-reactive (12/06 0000)  GBS: Positive (06/15 0000)   Assessment/Plan: IUP at 38 w 3 days Early labor Previous C Section Repeat LTCS Risks reviewed Consent signed   Kim [name] 05/12/2016, 6:13 PM

## 2016-05-13 ENCOUNTER — Encounter (HOSPITAL_COMMUNITY): Payer: Self-pay | Admitting: Obstetrics and Gynecology

## 2016-05-13 LAB — CBC
HEMATOCRIT: 28.2 % — AB (ref 36.0–46.0)
HEMOGLOBIN: 9.9 g/dL — AB (ref 12.0–15.0)
MCH: 32.8 pg (ref 26.0–34.0)
MCHC: 35.1 g/dL (ref 30.0–36.0)
MCV: 93.4 fL (ref 78.0–100.0)
Platelets: 132 10*3/uL — ABNORMAL LOW (ref 150–400)
RBC: 3.02 MIL/uL — ABNORMAL LOW (ref 3.87–5.11)
RDW: 13.3 % (ref 11.5–15.5)
WBC: 7.5 10*3/uL (ref 4.0–10.5)

## 2016-05-13 NOTE — Lactation Note (Signed)
This note was copied from a baby's chart. Lactation Consultation Note  Patient Name: Kim [name] ZOXWR'UToday's Date: 05/13/2016 Reason for consult: Follow-up assessment Baby at 18 hr of life and mom reports bf is going well. She has been able to manually express "large" drops of colostrum which is something she was not able to do with her older child. She has felt breast changes since birth. She has pumped x1 but baby woke and wanted to go to breast while she was pumping. Suggested she post pump instead of trying to pump between feedings and she was agreeable. Discussed baby behavior, feeding frequency, baby belly size, voids, wt loss, breast changes, and nipple care. Discussed when and ways to supplement. She wants to wait until she can no longer express colostrum or until baby shows symptoms of low milk supply before she offers formula. She is aware of lactation services and support group. She will call as needed.     Maternal Data    Feeding Length of feed: 5 min (fell asleep)  LATCH Score/Interventions                      Lactation Tools Discussed/Used WIC Program: No   Consult Status Consult Status: Follow-up Date: 05/14/16 Follow-up type: In-patient    [name] 05/13/2016, 1:10 PM

## 2016-05-13 NOTE — Discharge Instructions (Signed)
°Iron-Rich Diet ° °Iron is a mineral that helps your body to produce hemoglobin. Hemoglobin is a protein in your red blood cells that carries oxygen to your body's tissues. Eating too little iron may cause you to feel weak and tired, and it can increase your risk for infection. Eating enough iron is necessary for your body's metabolism, muscle function, and nervous system. °Iron is naturally found in many foods. It can also be added to foods or fortified in foods. There are two types of dietary iron: °· Heme iron. Heme iron is absorbed by the body more easily than nonheme iron. Heme iron is found in meat, poultry, and fish. °· Nonheme iron. Nonheme iron is found in dietary supplements, iron-fortified grains, beans, and vegetables. °You may need to follow an iron-rich diet if: °· You have been diagnosed with iron deficiency or iron-deficiency anemia. °· You have a condition that prevents you from absorbing dietary iron, such as: °¨ Infection in your intestines. °¨ Celiac disease. This involves long-lasting (chronic) inflammation of your intestines. °· You do not eat enough iron. °· You eat a diet that is high in foods that impair iron absorption. °· You have lost a lot of blood. °· You have heavy bleeding during your menstrual cycle. °· You are pregnant. °WHAT IS MY PLAN? °Your health care provider may help you to determine how much iron you need per day based on your condition. Generally, when a person consumes sufficient amounts of iron in the diet, the following iron needs are met: °· Men. °¨ 14-18 years old: 11 mg per day. °¨ 19-50 years old: 8 mg per day. °· Women.   °¨ 14-18 years old: 15 mg per day. °¨ 19-50 years old: 18 mg per day. °¨ Over 50 years old: 8 mg per day. °¨ Pregnant women: 27 mg per day. °¨ Breastfeeding women: 9 mg per day. °WHAT DO I NEED TO KNOW ABOUT AN IRON-RICH DIET? °· Eat fresh fruits and vegetables that are high in vitamin C along with foods that are high in iron. This will help  increase the amount of iron that your body absorbs from food, especially with foods containing nonheme iron. Foods that are high in vitamin C include oranges, peppers, tomatoes, and mango. °· Take iron supplements only as directed by your health care provider. Overdose of iron can be life-threatening. If you were prescribed iron supplements, take them with orange juice or a vitamin C supplement. °· Cook foods in pots and pans that are made from iron.   °· Eat nonheme iron-containing foods alongside foods that are high in heme iron. This helps to improve your iron absorption.   °· Certain foods and drinks contain compounds that impair iron absorption. Avoid eating these foods in the same meal as iron-rich foods or with iron supplements. These include: °¨ Coffee, black tea, and red wine. °¨ Milk, dairy products, and foods that are high in calcium. °¨ Beans, soybeans, and peas. °¨ Whole grains. °· When eating foods that contain both nonheme iron and compounds that impair iron absorption, follow these tips to absorb iron better.   °¨ Soak beans overnight before cooking. °¨ Soak whole grains overnight and drain them before using. °¨ Ferment flours before baking, such as using yeast in bread dough. °WHAT FOODS CAN I EAT? °Grains  °Iron-fortified breakfast cereal. Iron-fortified whole-wheat bread. Enriched rice. Sprouted grains. °Vegetables  °Spinach. Potatoes with skin. Green peas. Broccoli. Red and green bell peppers. Fermented vegetables. °Fruits  °Prunes. Raisins. Oranges. Strawberries. Mango. Grapefruit. °Meats and Other   Protein Sources  °Beef liver. Oysters. Beef. Shrimp. Turkey. Chicken. Tuna. Sardines. Chickpeas. Nuts. Tofu. °Beverages  °Tomato juice. Fresh orange juice. Prune juice. Hibiscus tea. Fortified instant breakfast shakes. °Condiments  °Tahini. Fermented soy sauce.  °Sweets and Desserts  °Black-strap molasses.  °Other  °Wheat germ. °The items listed above may not be a complete list of recommended foods or  beverages. Contact your dietitian for more options.  °WHAT FOODS ARE NOT RECOMMENDED? °Grains  °Whole grains. Bran cereal. Bran flour. Oats. °Vegetables  °Artichokes. Brussels sprouts. Kale. °Fruits  °Blueberries. Raspberries. Strawberries. Figs. °Meats and Other Protein Sources  °Soybeans. Products made from soy protein. °Dairy  °Milk. Cream. Cheese. Yogurt. Cottage cheese. °Beverages  °Coffee. Black tea. Red wine. °Sweets and Desserts  °Cocoa. Chocolate. Ice cream. °Other  °Basil. Oregano. Parsley. °The items listed above may not be a complete list of foods and beverages to avoid. Contact your dietitian for more information.  °  °This information is not intended to replace advice given to you by your health care provider. Make sure you discuss any questions you have with your health care provider. °  °Document Released: 06/18/2005 Document Revised: 11/25/2014 Document Reviewed: 06/01/2014 °Elsevier Interactive Patient Education ©2016 [employer] ° ° ° ° °

## 2016-05-13 NOTE — Lactation Note (Addendum)
This note was copied from a baby's chart. Lactation Consultation Note Experienced BF mom for 8 months. Mom has implants. Had to supplement w/her daughter 8 oz a day d/t not enough milk supply. Mom wanted to know when should she start supplementing. Reviewed I&O. Mom has personal DEBP. Mom states she "HATES" to pump. Encouraged pumping after BF to stimulate breast. Mom knows to pump q3h for 15-20 min. Mom states she has been leaking colostrum for several months which she didn't do that with her first child. Mom has small everted nipples. Mom BF baby in cradle position, baby BF off and on. Very fussy. When baby comes off of nipple, noted pinched appearance. Mom is readjusting chin for wider deeper flange.baby had a great flange when I saw latch. Baby just will not stay on. Comes off and cries. Mom hand expressed w/colostrum noted. Encouraged to occasionally massage breast during feeding may make baby stop being fussy. Changed baby's diaper, noted large void. When crying, noted tongue curling up on sides. Baby can extend tongue past gum line. Baby's face has edema. Weights 9.10 lbs. Mom encouraged to feed baby 8-12 times/24 hours and with feeding cues. Referred to Baby and Me Book in Breastfeeding section Pg. 22-23 for position options and Proper latch demonstration. Educated about newborn behavior, I&O, cluster feeding, supply and demand. WH/LC brochure given w/resources, support groups and LC services..     Patient Name: Kim Brooks SailorsJennifer Whitham Today's Date: 05/13/2016 Reason for consult: Initial assessment   Maternal Data Has patient been taught Hand Expression?: Yes Does the patient have breastfeeding experience prior to this delivery?: Yes  Feeding Feeding Type: Breast Fed Length of feed: 20 min  LATCH Score/Interventions Latch: Repeated attempts needed to sustain latch, nipple held in mouth throughout feeding, stimulation needed to elicit sucking reflex. Intervention(s): Adjust position;Assist  with latch;Breast massage;Breast compression  Audible Swallowing: A few with stimulation Intervention(s): Skin to skin;Hand expression;Alternate breast massage  Type of Nipple: Everted at rest and after stimulation  Comfort (Breast/Nipple): Soft / non-tender     Hold (Positioning): No assistance needed to correctly position infant at breast. Intervention(s): Skin to skin;Position options;Support Pillows;Breastfeeding basics reviewed  LATCH Score: 8  Lactation Tools Discussed/Used Tools: Pump Breast pump type: Double-Electric Breast Pump Pump Review: Milk Storage Date initiated:: 05/13/16   Consult Status Consult Status: Follow-up Date: 05/13/16 (in pm) Follow-up type: In-patient    Mane Consolo, Diamond NickelLAURA G 05/13/2016, 3:02 AM

## 2016-05-13 NOTE — Progress Notes (Signed)
Subjective: Postpartum Day 1: Cesarean Delivery Patient reports tolerating PO.    Objective: Vital signs in last 24 hours: Temp:  [97.5 F (36.4 C)-98.7 F (37.1 C)] 98.4 F (36.9 C) (06/26 0335) Pulse Rate:  [67-96] 68 (06/26 0335) Resp:  [14-26] 18 (06/26 0335) BP: (104-159)/(53-103) 114/63 mmHg (06/26 0335) SpO2:  [95 %-100 %] 96 % (06/26 0335) Weight:  [206 lb 4 oz (93.554 kg)] 206 lb 4 oz (93.554 kg) (06/25 1514)  Physical Exam:  General: alert and cooperative Lochia: appropriate Uterine Fundus: firm Incision: small old drainage noted on bandage DVT Evaluation: No evidence of DVT seen on physical exam. Negative Homan's sign. No cords or calf tenderness. No significant calf/ankle edema.   Recent Labs  05/12/16 1545 05/13/16 0648  HGB 11.7* 9.9*  HCT 33.8* 28.2*    Assessment/Plan: Status post Cesarean section. Doing well postoperatively.  Continue current care.  Karime Scheuermann G 05/13/2016, 7:49 AM

## 2016-05-14 LAB — RPR: RPR: NONREACTIVE

## 2016-05-14 LAB — BIRTH TISSUE RECOVERY COLLECTION (PLACENTA DONATION)

## 2016-05-14 NOTE — Lactation Note (Signed)
This note was copied from a baby's chart. Lactation Consultation Note Mom has implants and had to supplement first child. Mom brought her personal DEBP, encouraged mom to post-pump. Mom states she hates to pump. Encouraged mom to pump to stimulate milk supply for BF. Discussed supplementing d/t 7% weight loss. 28 hrs. Old had 5 voids, 3 stools. Large baby had edema to face. Explained large baby's usually loose more weight but output doesn't account for 7%. LC recommend supplementing w/colostrum then formula. Discussed options, mom chose curve tip syring. Discussed house to feed. Encouraged to call for assistance at feeding if needed. Patient Name: Kim Brooks SailorsJennifer Abdulaziz [name]'XToday's Date: 05/14/2016 Reason for consult: Follow-up assessment;Infant weight loss   Maternal Data    Feeding Feeding Type: Breast Fed Length of feed: 5 min  LATCH Score/Interventions Latch: Grasps breast easily, tongue down, lips flanged, rhythmical sucking.  Audible Swallowing: A few with stimulation  Type of Nipple: Everted at rest and after stimulation  Comfort (Breast/Nipple): Soft / non-tender  Problem noted: Mild/Moderate discomfort Interventions (Mild/moderate discomfort): Hand expression  Hold (Positioning): Assistance needed to correctly position infant at breast and maintain latch. Intervention(s): Support Pillows;Position options  LATCH Score: 8  Lactation Tools Discussed/Used     Consult Status Consult Status: Follow-up Date: 05/14/16 Follow-up type: In-patient    Hillman Attig, Diamond NickelLAURA [name] 05/14/2016, 2:02 AM

## 2016-05-14 NOTE — Lactation Note (Addendum)
This note was copied from a baby's chart. Lactation Consultation Note  Patient Name: Girl Brooks SailorsJennifer Benthall ZOXWR'UToday's Date: 05/14/2016 Reason for consult: Follow-up assessment Baby 40 hours old. Mom c/o sore nipples and requesting comfort gels d/t breaking out on her torso when she used coconut oil with first child--and they were given with review. Mom states that baby is latching well--without NS, but she is sore from the nipple being stretched out with pumping and nursing. Mom states that she is seeing colostrum and hearing swallows at breast. Mom also reports that she had low milk supply with first child and had to use DEBP. Enc mom to keep pumping after feeding baby, especially during the first 2 weeks of lactation, and mom has DEBP at home. Mom aware of OP/BFSG and LC phone line assistance after D/C.    Maternal Data    Feeding Feeding Type: Formula Length of feed: 15 min  LATCH Score/Interventions                      Lactation Tools Discussed/Used     Consult Status Consult Status: PRN    Geralynn OchsWILLIARD, Shiah 05/14/2016, 10:53 AM

## 2016-05-14 NOTE — Discharge Summary (Signed)
Obstetric Discharge Summary Reason for Admission: onset of labor Prenatal Procedures: ultrasound Intrapartum Procedures: cesarean: low cervical, transverse Postpartum Procedures: none Complications-Operative and Postpartum: none HEMOGLOBIN  Date Value Ref Range Status  05/13/2016 9.9* 12.0 - 15.0 [name]/dL Final   HCT  Date Value Ref Range Status  05/13/2016 28.2* 36.0 - 46.0 % Final    Physical Exam:  General: alert and cooperative Lochia: appropriate Uterine Fundus: firm Incision: healing well DVT Evaluation: No evidence of DVT seen on physical exam. Negative Homan's sign. No cords or calf tenderness. No significant calf/ankle edema.  Discharge Diagnoses: Term Pregnancy-delivered  Discharge Information: Date: 05/14/2016 Activity: pelvic rest Diet: routine Medications: PNV and Ibuprofen Condition: stable Instructions: refer to practice specific booklet Discharge to: home   Newborn Data: Live born female  Birth Weight: 9 lb 10.3 oz (4375 [name]) APGAR: 8, 9  Home with mother.  Kim [name] 05/14/2016, 8:25 AM

## 2016-05-15 NOTE — Anesthesia Postprocedure Evaluation (Signed)
Anesthesia Post Note  Patient: Kim [name]  Procedure(s) Performed: Procedure(s) (LRB): CESAREAN SECTION (N/A)  Patient location during evaluation: PACU Anesthesia Type: Spinal Level of consciousness: oriented and awake and alert Pain management: pain level controlled Vital Signs Assessment: post-procedure vital signs reviewed and stable Respiratory status: spontaneous breathing, respiratory function stable and patient connected to nasal cannula oxygen Cardiovascular status: blood pressure returned to baseline and stable Postop Assessment: no headache and no backache Anesthetic complications: no     Last Vitals:  Filed Vitals:   05/13/16 1738 05/14/16 0520  BP: 111/72 114/73  Pulse: 58 65  Temp: 37 C 36.9 C  Resp: 18 18    Last Pain:  Filed Vitals:   05/14/16 1216  PainSc: 1    Pain Goal: Patients Stated Pain Goal: 4 (05/13/16 0619)               Phillips Groutarignan, Leslieann Whisman

## 2016-05-16 ENCOUNTER — Encounter (HOSPITAL_COMMUNITY)
Admission: RE | Admit: 2016-05-16 | Discharge: 2016-05-16 | Disposition: A | Discharge status: Home / Self Care | Payer: PRIVATE HEALTH INSURANCE | Source: Ambulatory Visit | Attending: Family Medicine | Admitting: Family Medicine

## 2016-05-16 HISTORY — DX: Unspecified asthma, uncomplicated: J45.909

## 2016-05-16 HISTORY — DX: Personal history of other infectious and parasitic diseases: Z86.19

## 2016-05-17 ENCOUNTER — Inpatient Hospital Stay (HOSPITAL_COMMUNITY)
Admission: RE | Admit: 2016-05-17 | Payer: PRIVATE HEALTH INSURANCE | Source: Ambulatory Visit | Admitting: Obstetrics & Gynecology

## 2016-05-17 ENCOUNTER — Encounter (HOSPITAL_COMMUNITY): Admission: RE | Payer: Self-pay | Source: Ambulatory Visit

## 2016-05-17 SURGERY — Surgical Case
Anesthesia: Regional

## 2022-07-29 ENCOUNTER — Ambulatory Visit: Payer: Self-pay

## 2022-11-24 ENCOUNTER — Ambulatory Visit
Admission: RE | Admit: 2022-11-24 | Discharge: 2022-11-24 | Disposition: A | Discharge status: Home / Self Care | Payer: PRIVATE HEALTH INSURANCE | Source: Ambulatory Visit

## 2022-11-24 VITALS — BP 127/82 | HR 90 | Temp 98.5°F | Resp 16

## 2022-11-24 DIAGNOSIS — J029 Acute pharyngitis, unspecified: Secondary | ICD-10-CM | POA: Diagnosis not present

## 2022-11-24 DIAGNOSIS — J069 Acute upper respiratory infection, unspecified: Secondary | ICD-10-CM

## 2022-11-24 LAB — POCT RAPID STREP A (OFFICE): Rapid Strep A Screen: NEGATIVE

## 2022-11-24 MED ORDER — DOXYCYCLINE HYCLATE 100 MG PO TABS: 100.0000 mg | ORAL_TABLET | Freq: Two times a day (BID) | ORAL | 0 refills | Status: AC

## 2022-11-24 MED ORDER — PSEUDOEPH-BROMPHEN-DM 30-2-10 MG/5ML PO SYRP: 5.0000 mL | ORAL_SOLUTION | Freq: Four times a day (QID) | ORAL | 0 refills | Status: AC | PRN

## 2022-11-24 MED ORDER — ALBUTEROL SULFATE HFA 108 (90 BASE) MCG/ACT IN AERS: 2.0000 | INHALATION_SPRAY | Freq: Four times a day (QID) | RESPIRATORY_TRACT | 0 refills | Status: AC | PRN

## 2022-11-24 MED ORDER — CETIRIZINE-PSEUDOEPHEDRINE ER 5-120 MG PO TB12: 1.0000 | ORAL_TABLET | Freq: Every day | ORAL | 0 refills | Status: AC

## 2022-11-24 NOTE — ED Provider Notes (Signed)
[facility]    CSN: [id number] Arrival date & time: 11/24/22  0849      History   Chief Complaint Chief Complaint  Patient presents with   Cough    Sore throat,  chest congestion - Entered by patient   Appointment    0900    HPI Kim [name] is a 37 y.o. female.   The history is provided by the patient.   Presents with a 5-day history of sore throat, cough, and chest congestion.  Patient denies fever, chills, ear pain, headache, wheezing, shortness of breath, difficulty breathing, or GI symptoms.  Reports she has been taking Motrin and Sudafed which gave her some relief.  Patient reports that she took 2 home COVID test, both were negative.  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 09/28/2013   CIN II (cervical intraepithelial neoplasia II) 09/28/2013    Past Surgical History:  Procedure Laterality Date   BREAST ENHANCEMENT SURGERY  11/10   CESAREAN SECTION N/A 07/15/2014   Procedure: PRIMARY CESAREAN SECTION;  Surgeon: Cyril Mourning, MD;  Location: [facility];  Service: Obstetrics;  Laterality: N/A;  [facility] 08/02/14   CESAREAN SECTION N/A 05/12/2016   Procedure: CESAREAN SECTION;  Surgeon: Dian Queen, MD;  Location: [place];  Service: Obstetrics;  Laterality: N/A;   NO PAST SURGERIES     VULVA /PERINEUM BIOPSY  12/2003   Condyloma    OB History     Gravida  2   Para  2   Term  2   Preterm      AB      Living  2      SAB      IAB      Ectopic      Multiple  0   Live Births  2            Home Medications    Prior to Admission medications   Medication Sig Start Date End Date Taking? Authorizing Provider  albuterol (VENTOLIN HFA) 108 (90 Base) MCG/ACT inhaler Inhale 2 puffs into the  lungs every 6 (six) hours as needed for wheezing or shortness of breath. 11/24/22  Yes Shailene Demonbreun-Warren, Alda Lea, NP  brompheniramine-pseudoephedrine-DM 30-2-10 MG/5ML syrup Take 5 mLs by mouth 4 (four) times daily as needed. 11/24/22  Yes Alyana Kreiter-Warren, Alda Lea, NP  cetirizine (ZYRTEC) 10 MG tablet Take 10 mg by mouth daily.   Yes [provider]  cetirizine-pseudoephedrine (ZYRTEC-D) 5-120 MG tablet Take 1 tablet by mouth daily. 11/24/22  Yes Casmere Hollenbeck-Warren, Alda Lea, NP  doxycycline (VIBRA-TABS) 100 MG tablet Take 1 tablet (100 mg total) by mouth 2 (two) times daily for 7 days. 11/29/22 12/06/22 Yes Nicolas Sisler-Warren, Alda Lea, NP  ibuprofen (ADVIL) 200 MG tablet Take 200 mg by mouth every 6 (six) hours as needed.   Yes [provider]  acetaminophen (TYLENOL) 325 MG tablet Take 325 mg by mouth every 6 (six) hours as needed for headache.    [provider]  fluticasone (FLONASE) 50 MCG/ACT nasal spray Place 1 spray into both nostrils daily.    [provider]  Prenatal Vit-Fe Fumarate-FA (MULTIVITAMIN-PRENATAL) 27-0.8 MG TABS tablet Take 1 tablet by mouth daily at 12 noon.  [provider]    Family History Family History  Problem Relation Age of Onset   Cancer Paternal Aunt        breast   Cancer Maternal Grandmother        colon    Social History Social History   Tobacco Use   Smoking status: Former  Substance Use Topics   Alcohol use: No   Drug use: No     Allergies   Biaxin [clarithromycin], Cefaclor, Penicillins, Sulfa antibiotics, and Codeine   Review of Systems Review of Systems Per HPI  Physical Exam Triage Vital Signs ED Triage Vitals  Enc Vitals Group     BP 11/24/22 0914 127/82     Pulse Rate 11/24/22 0914 90     Resp 11/24/22 0914 16     Temp 11/24/22 0914 98.5 F (36.9 C)     Temp Source 11/24/22 0914 Oral     SpO2 11/24/22 0914 99 %     Weight --      Height --      Head Circumference --      Peak Flow --       Pain Score 11/24/22 0916 7     Pain Loc --      Pain Edu? --      Excl. in [place]? --    No data found.  Updated Vital Signs BP 127/82 (BP Location: Right Arm)   Pulse 90   Temp 98.5 F (36.9 C) (Oral)   Resp 16   LMP  (Within Months) Comment: 1 month  SpO2 99%   Breastfeeding No   Visual Acuity Right Eye Distance:   Left Eye Distance:   Bilateral Distance:    Right Eye Near:   Left Eye Near:    Bilateral Near:     Physical Exam Vitals and nursing note reviewed.  Constitutional:      General: She is not in acute distress.    Appearance: Normal appearance.  HENT:     Head: Normocephalic.     Right Ear: Tympanic membrane, ear canal and external ear normal.     Left Ear: Tympanic membrane, ear canal and external ear normal.     Nose: Congestion present. No rhinorrhea.     Right Turbinates: Enlarged and swollen.     Left Turbinates: Enlarged and swollen.     Right Sinus: No maxillary sinus tenderness or frontal sinus tenderness.     Left Sinus: No maxillary sinus tenderness or frontal sinus tenderness.     Mouth/Throat:     Lips: Pink.     Mouth: Mucous membranes are moist.     Pharynx: Posterior oropharyngeal erythema present. No pharyngeal swelling or oropharyngeal exudate.     Tonsils: No tonsillar exudate.  Eyes:     Extraocular Movements: Extraocular movements intact.     Conjunctiva/sclera: Conjunctivae normal.     Pupils: Pupils are equal, round, and reactive to light.  Cardiovascular:     Rate and Rhythm: Normal rate and regular rhythm.     Pulses: Normal pulses.     Heart sounds: Normal heart sounds.  Pulmonary:     Effort: Pulmonary effort is normal. No respiratory distress.     Breath sounds: Normal breath sounds. No stridor. No wheezing, rhonchi or rales.  Abdominal:     General: Bowel sounds are normal.     Palpations: Abdomen is soft.     Tenderness: There is no abdominal tenderness.  Musculoskeletal:     Cervical  back: Normal range of motion.   Lymphadenopathy:     Cervical: No cervical adenopathy.  Skin:    General: Skin is warm and dry.  Neurological:     Mental Status: She is alert and oriented to person, place, and time.  Psychiatric:        Mood and Affect: Mood normal.        Behavior: Behavior normal.      UC Treatments / Results  Labs (all labs ordered are listed, but only abnormal results are displayed) Labs Reviewed  POCT RAPID STREP A (OFFICE)    EKG   Radiology No results found.  Procedures Procedures (including critical care time)  Medications Ordered in UC Medications - No data to display  Initial Impression / Assessment and Plan / UC Course  I have reviewed the triage vital signs and the nursing notes.  Pertinent labs & imaging results that were available during my care of the patient were reviewed by me and considered in my medical decision making (see chart for details).  The patient is well-appearing, she is in no acute distress, vital signs are stable.  Strep test is negative, throat culture is not indicated at this time as symptoms most likely related to postnasal drainage.  Suspect a viral upper respiratory infection at this time.  Patient has not had fever, no wheezing, shortness of breath, or difficulty breathing.  Patient has been taking Motrin and Sudafed for her symptoms.  Will start patient on Bromfed-DM for the cough and nasal congestion, and Zyrtec-D to help as an antihistamine and decongestant.  Also on albuterol inhaler for possible wheezing or shortness of breath was prescribed.  Patient was advised to begin ipratropium that she has at home to help with her nasal congestion along with normal saline nasal spray.  If symptoms do not improve given her underlying history of asthma, a prescription for azithromycin 250 mg has been sent to her preferred pharmacy to pick up in 5 days if her symptoms fail to improve.  Patient is in agreement with this plan of care.  Patient verbalizes  understanding.  All questions were answered.  Patient is stable for discharge.   Final Clinical Impressions(s) / UC Diagnoses   Final diagnoses:  Viral upper respiratory tract infection with cough     Discharge Instructions      The rapid strep test is negative. Take medication as prescribed.  Can use of the ipratropium bromide nasal spray that you have at home. Increase fluids and allow for plenty of rest. Recommend Tylenol or ibuprofen as needed for pain, fever, or general discomfort. Warm salt water gargles 3-4 times daily to help with throat pain or discomfort. Use a normal saline nasal spray to help with nasal congestion throughout the day. As discussed, if your symptoms do not improve by 11/29/2022, pick up your prescription for the antibiotic at your preferred pharmacy. Recommend using a humidifier at bedtime during sleep to help with cough and nasal congestion. Sleep elevated on 2 pillows while cough symptoms persist. If symptoms fail to improve after this treatment, please follow-up with your primary care physician for further evaluation. Follow-up as needed.     ED Prescriptions     Medication Sig Dispense Auth. Provider   brompheniramine-pseudoephedrine-DM 30-2-10 MG/5ML syrup Take 5 mLs by mouth 4 (four) times daily as needed. 140 mL Cranford Blessinger-Warren, Sadie Haber, NP   cetirizine-pseudoephedrine (ZYRTEC-D) 5-120 MG tablet Take 1 tablet by mouth daily. 30 tablet Jacquelynne Guedes-Warren, Sadie Haber, NP  albuterol (VENTOLIN HFA) 108 (90 Base) MCG/ACT inhaler Inhale 2 puffs into the lungs every 6 (six) hours as needed for wheezing or shortness of breath. 8 g Kimberle Stanfill-Warren, Sadie Haber, NP   doxycycline (VIBRA-TABS) 100 MG tablet Take 1 tablet (100 mg total) by mouth 2 (two) times daily for 7 days. 14 tablet Shaneka Efaw-Warren, Sadie Haber, NP      PDMP not reviewed this encounter.   Abran Cantor, NP 11/24/22 [phone]

## 2022-11-24 NOTE — Discharge Instructions (Addendum)
The rapid strep test is negative. Take medication as prescribed.  Can use of the ipratropium bromide nasal spray that you have at home. Increase fluids and allow for plenty of rest. Recommend Tylenol or ibuprofen as needed for pain, fever, or general discomfort. Warm salt water gargles 3-4 times daily to help with throat pain or discomfort. Use a normal saline nasal spray to help with nasal congestion throughout the day. As discussed, if your symptoms do not improve by 11/29/2022, pick up your prescription for the antibiotic at your preferred pharmacy. Recommend using a humidifier at bedtime during sleep to help with cough and nasal congestion. Sleep elevated on 2 pillows while cough symptoms persist. If symptoms fail to improve after this treatment, please follow-up with your primary care physician for further evaluation. Follow-up as needed.

## 2022-11-24 NOTE — ED Triage Notes (Signed)
Pt reports cough, sore throat and chest congestion x 5 days. Motrin and Sudafed gives some relief.
